# Patient Record
Sex: Female | Born: 1980 | Race: White | Hispanic: No | Marital: Single | Smoking: Current every day smoker
Health system: Southern US, Community
[De-identification: ages and names within clinical notes are randomized; demographics above are authoritative.]

## PROBLEM LIST (undated history)

## (undated) DIAGNOSIS — F4310 Post-traumatic stress disorder, unspecified: Secondary | ICD-10-CM

## (undated) DIAGNOSIS — K219 Gastro-esophageal reflux disease without esophagitis: Secondary | ICD-10-CM

## (undated) DIAGNOSIS — I509 Heart failure, unspecified: Secondary | ICD-10-CM

## (undated) DIAGNOSIS — G8929 Other chronic pain: Secondary | ICD-10-CM

## (undated) DIAGNOSIS — F32A Depression, unspecified: Secondary | ICD-10-CM

## (undated) DIAGNOSIS — F410 Panic disorder [episodic paroxysmal anxiety] without agoraphobia: Secondary | ICD-10-CM

## (undated) DIAGNOSIS — M549 Dorsalgia, unspecified: Secondary | ICD-10-CM

## (undated) DIAGNOSIS — M797 Fibromyalgia: Secondary | ICD-10-CM

## (undated) DIAGNOSIS — F419 Anxiety disorder, unspecified: Secondary | ICD-10-CM

## (undated) DIAGNOSIS — F4010 Social phobia, unspecified: Secondary | ICD-10-CM

## (undated) DIAGNOSIS — J45909 Unspecified asthma, uncomplicated: Secondary | ICD-10-CM

## (undated) DIAGNOSIS — F329 Major depressive disorder, single episode, unspecified: Secondary | ICD-10-CM

## (undated) DIAGNOSIS — F319 Bipolar disorder, unspecified: Secondary | ICD-10-CM

## (undated) HISTORY — PX: CHOLECYSTECTOMY: SHX55

---

## 2006-10-15 ENCOUNTER — Ambulatory Visit (HOSPITAL_COMMUNITY): Admission: RE | Admit: 2006-10-15 | Discharge: 2006-10-15 | Payer: Self-pay | Admitting: Family Medicine

## 2007-09-10 ENCOUNTER — Ambulatory Visit (HOSPITAL_COMMUNITY): Admission: RE | Admit: 2007-09-10 | Discharge: 2007-09-10 | Payer: Self-pay | Admitting: Neurology

## 2008-11-20 ENCOUNTER — Emergency Department (HOSPITAL_COMMUNITY): Admission: EM | Admit: 2008-11-20 | Discharge: 2008-11-21 | Payer: Self-pay | Admitting: Emergency Medicine

## 2008-11-20 ENCOUNTER — Encounter (INDEPENDENT_AMBULATORY_CARE_PROVIDER_SITE_OTHER): Payer: Self-pay | Admitting: *Deleted

## 2008-11-20 LAB — CONVERTED CEMR LAB
BUN: 9 mg/dL
CO2: 28 meq/L
Chloride: 109 meq/L
Creatinine, Ser: 0.72 mg/dL
Glomerular Filtration Rate, Af Am: >60 mL/min/{1.73_m2}
Glucose, Bld: 78 mg/dL
Platelets: 285 10*3/uL
Potassium: 2.9 meq/L

## 2008-11-21 ENCOUNTER — Inpatient Hospital Stay (HOSPITAL_COMMUNITY): Admission: EM | Admit: 2008-11-21 | Discharge: 2008-11-25 | Payer: Self-pay | Admitting: Psychiatry

## 2008-11-21 ENCOUNTER — Ambulatory Visit: Payer: Self-pay | Admitting: Psychiatry

## 2008-11-22 ENCOUNTER — Encounter (INDEPENDENT_AMBULATORY_CARE_PROVIDER_SITE_OTHER): Payer: Self-pay | Admitting: *Deleted

## 2008-11-22 ENCOUNTER — Emergency Department (HOSPITAL_COMMUNITY): Admission: EM | Admit: 2008-11-22 | Discharge: 2008-11-22 | Payer: Self-pay | Admitting: Emergency Medicine

## 2008-11-22 LAB — CONVERTED CEMR LAB
ALT: 14 units/L
Albumin: 4.2 g/dL
CO2: 22 meq/L
Calcium: 9.3 mg/dL
Chloride: 110 meq/L
Glomerular Filtration Rate, Af Am: >60 mL/min/{1.73_m2}
Glucose, Bld: 98 mg/dL
MCV: 83.9 fL
Protein, ur: NEGATIVE mg/dL
Sodium: 140 meq/L
Specific Gravity, Urine: 1.007
Total Protein: 7.1 g/dL
Urine Glucose: NEGATIVE mg/dL
WBC: 7.5 10*3/uL

## 2008-12-11 DIAGNOSIS — K219 Gastro-esophageal reflux disease without esophagitis: Secondary | ICD-10-CM

## 2008-12-11 DIAGNOSIS — M129 Arthropathy, unspecified: Secondary | ICD-10-CM | POA: Insufficient documentation

## 2008-12-11 DIAGNOSIS — J45909 Unspecified asthma, uncomplicated: Secondary | ICD-10-CM | POA: Insufficient documentation

## 2008-12-11 DIAGNOSIS — IMO0001 Reserved for inherently not codable concepts without codable children: Secondary | ICD-10-CM

## 2008-12-16 ENCOUNTER — Encounter (INDEPENDENT_AMBULATORY_CARE_PROVIDER_SITE_OTHER): Payer: Self-pay | Admitting: *Deleted

## 2010-02-28 ENCOUNTER — Encounter: Payer: Self-pay | Admitting: Family Medicine

## 2010-05-12 LAB — COMPREHENSIVE METABOLIC PANEL
ALT: 14 U/L (ref 0–35)
AST: 15 U/L (ref 0–37)
Albumin: 4.2 g/dL (ref 3.5–5.2)
Alkaline Phosphatase: 53 U/L (ref 39–117)
Alkaline Phosphatase: 57 U/L (ref 39–117)
BUN: 8 mg/dL (ref 6–23)
Calcium: 9.1 mg/dL (ref 8.4–10.5)
Calcium: 9.3 mg/dL (ref 8.4–10.5)
Chloride: 104 mEq/L (ref 96–112)
Chloride: 110 mEq/L (ref 96–112)
Creatinine, Ser: 0.6 mg/dL (ref 0.4–1.2)
Creatinine, Ser: 0.62 mg/dL (ref 0.4–1.2)
GFR calc Af Amer: >60 mL/min (ref >60)
GFR calc Af Amer: >60 mL/min (ref >60)
Glucose, Bld: 98 mg/dL (ref 70–99)
Potassium: 3.8 mEq/L (ref 3.5–5.1)
Total Bilirubin: 0.4 mg/dL (ref 0.3–1.2)
Total Protein: 7.1 g/dL (ref 6.0–8.3)

## 2010-05-12 LAB — DIFFERENTIAL
Basophils Absolute: 0 10*3/uL (ref 0.0–0.1)
Basophils Relative: 0 % (ref 0–1)
Eosinophils Absolute: 0.1 10*3/uL (ref 0.0–0.7)
Eosinophils Relative: 0 % (ref 0–5)
Lymphocytes Relative: 36 % (ref 12–46)
Monocytes Absolute: 0.4 10*3/uL (ref 0.1–1.0)
Monocytes Relative: 5 % (ref 3–12)
Neutro Abs: 5.3 10*3/uL (ref 1.7–7.7)
Neutro Abs: 5.4 10*3/uL (ref 1.7–7.7)

## 2010-05-12 LAB — LIPASE, BLOOD: Lipase: 31 U/L (ref 11–59)

## 2010-05-12 LAB — URINALYSIS, ROUTINE W REFLEX MICROSCOPIC
Glucose, UA: NEGATIVE mg/dL
Nitrite: NEGATIVE
Protein, ur: NEGATIVE mg/dL
Specific Gravity, Urine: 1.007 (ref 1.005–1.030)
Urobilinogen, UA: 0.2 mg/dL (ref 0.0–1.0)
pH: 5.5 (ref 5.0–8.0)

## 2010-05-12 LAB — RAPID URINE DRUG SCREEN, HOSP PERFORMED
Amphetamines: NOT DETECTED
Barbiturates: NOT DETECTED
Benzodiazepines: POSITIVE — AB
Opiates: POSITIVE — AB

## 2010-05-12 LAB — BASIC METABOLIC PANEL
CO2: 28 mEq/L (ref 19–32)
Creatinine, Ser: 0.72 mg/dL (ref 0.4–1.2)
GFR calc non Af Amer: >60 mL/min (ref >60)
Sodium: 141 mEq/L (ref 135–145)

## 2010-05-12 LAB — PREGNANCY, URINE: Preg Test, Ur: NEGATIVE

## 2010-05-12 LAB — URINE MICROSCOPIC-ADD ON

## 2010-05-12 LAB — CBC
Hemoglobin: 12 g/dL (ref 12.0–15.0)
MCV: 83.9 fL (ref 78.0–100.0)
Platelets: 272 10*3/uL (ref 150–400)
RBC: 4.27 MIL/uL (ref 3.87–5.11)

## 2012-05-26 ENCOUNTER — Emergency Department (HOSPITAL_COMMUNITY): Payer: Self-pay

## 2012-05-26 ENCOUNTER — Encounter (HOSPITAL_COMMUNITY): Payer: Self-pay | Admitting: Emergency Medicine

## 2012-05-26 ENCOUNTER — Emergency Department (HOSPITAL_COMMUNITY)
Admission: EM | Admit: 2012-05-26 | Discharge: 2012-05-26 | Disposition: A | Discharge status: Home / Self Care | Payer: Self-pay | Attending: Emergency Medicine | Admitting: Emergency Medicine

## 2012-05-26 DIAGNOSIS — R319 Hematuria, unspecified: Secondary | ICD-10-CM | POA: Insufficient documentation

## 2012-05-26 DIAGNOSIS — Z8709 Personal history of other diseases of the respiratory system: Secondary | ICD-10-CM | POA: Insufficient documentation

## 2012-05-26 DIAGNOSIS — R1031 Right lower quadrant pain: Secondary | ICD-10-CM | POA: Insufficient documentation

## 2012-05-26 DIAGNOSIS — Z7982 Long term (current) use of aspirin: Secondary | ICD-10-CM | POA: Insufficient documentation

## 2012-05-26 DIAGNOSIS — F411 Generalized anxiety disorder: Secondary | ICD-10-CM | POA: Insufficient documentation

## 2012-05-26 DIAGNOSIS — Z3202 Encounter for pregnancy test, result negative: Secondary | ICD-10-CM | POA: Insufficient documentation

## 2012-05-26 DIAGNOSIS — R109 Unspecified abdominal pain: Secondary | ICD-10-CM

## 2012-05-26 DIAGNOSIS — F4310 Post-traumatic stress disorder, unspecified: Secondary | ICD-10-CM | POA: Insufficient documentation

## 2012-05-26 DIAGNOSIS — F17200 Nicotine dependence, unspecified, uncomplicated: Secondary | ICD-10-CM | POA: Insufficient documentation

## 2012-05-26 DIAGNOSIS — IMO0002 Reserved for concepts with insufficient information to code with codable children: Secondary | ICD-10-CM | POA: Insufficient documentation

## 2012-05-26 DIAGNOSIS — Z79899 Other long term (current) drug therapy: Secondary | ICD-10-CM | POA: Insufficient documentation

## 2012-05-26 DIAGNOSIS — F319 Bipolar disorder, unspecified: Secondary | ICD-10-CM | POA: Insufficient documentation

## 2012-05-26 HISTORY — DX: Depression, unspecified: F32.A

## 2012-05-26 HISTORY — DX: Anxiety disorder, unspecified: F41.9

## 2012-05-26 HISTORY — DX: Major depressive disorder, single episode, unspecified: F32.9

## 2012-05-26 HISTORY — DX: Post-traumatic stress disorder, unspecified: F43.10

## 2012-05-26 HISTORY — DX: Bipolar disorder, unspecified: F31.9

## 2012-05-26 HISTORY — DX: Unspecified asthma, uncomplicated: J45.909

## 2012-05-26 LAB — COMPREHENSIVE METABOLIC PANEL WITH GFR
ALT: 11 U/L (ref 0–35)
AST: 14 U/L (ref 0–37)
Albumin: 4.1 g/dL (ref 3.5–5.2)
Alkaline Phosphatase: 65 U/L (ref 39–117)
BUN: 12 mg/dL (ref 6–23)
CO2: 27 meq/L (ref 19–32)
Calcium: 9.4 mg/dL (ref 8.4–10.5)
Chloride: 103 meq/L (ref 96–112)
Creatinine, Ser: 0.71 mg/dL (ref 0.50–1.10)
GFR calc Af Amer: >90 mL/min
GFR calc non Af Amer: >90 mL/min
Glucose, Bld: 76 mg/dL (ref 70–99)
Potassium: 3.1 meq/L — ABNORMAL LOW (ref 3.5–5.1)
Sodium: 142 meq/L (ref 135–145)
Total Bilirubin: 0.1 mg/dL — ABNORMAL LOW (ref 0.3–1.2)
Total Protein: 7.1 g/dL (ref 6.0–8.3)

## 2012-05-26 LAB — URINALYSIS, ROUTINE W REFLEX MICROSCOPIC
Glucose, UA: NEGATIVE mg/dL
Leukocytes, UA: NEGATIVE
Nitrite: NEGATIVE
Specific Gravity, Urine: 1.025 (ref 1.005–1.030)

## 2012-05-26 LAB — CBC WITH DIFFERENTIAL/PLATELET
Basophils Absolute: 0.1 10*3/uL (ref 0.0–0.1)
Basophils Relative: 1 % (ref 0–1)
Eosinophils Absolute: 0.2 10*3/uL (ref 0.0–0.7)
Eosinophils Relative: 2 % (ref 0–5)
HCT: 40.2 % (ref 36.0–46.0)
Hemoglobin: 13.6 g/dL (ref 12.0–15.0)
Lymphocytes Relative: 43 % (ref 12–46)
Lymphs Abs: 3.5 10*3/uL (ref 0.7–4.0)
MCH: 28.5 pg (ref 26.0–34.0)
MCHC: 33.8 g/dL (ref 30.0–36.0)
MCV: 84.1 fL (ref 78.0–100.0)
Monocytes Absolute: 0.5 10*3/uL (ref 0.1–1.0)
Monocytes Relative: 6 % (ref 3–12)
Neutro Abs: 3.9 10*3/uL (ref 1.7–7.7)
Neutrophils Relative %: 48 % (ref 43–77)
Platelets: 270 10*3/uL (ref 150–400)
RBC: 4.78 MIL/uL (ref 3.87–5.11)
RDW: 15.4 % (ref 11.5–15.5)
WBC: 8.1 10*3/uL (ref 4.0–10.5)

## 2012-05-26 LAB — URINE MICROSCOPIC-ADD ON

## 2012-05-26 LAB — POCT PREGNANCY, URINE: Preg Test, Ur: NEGATIVE

## 2012-05-26 MED ORDER — CIPROFLOXACIN HCL 500 MG PO TABS: 500.0000 mg | ORAL_TABLET | Freq: Two times a day (BID) | ORAL | Status: DC

## 2012-05-26 MED ORDER — RANITIDINE HCL 150 MG PO CAPS: 150.0000 mg | ORAL_CAPSULE | Freq: Two times a day (BID) | ORAL | Status: DC

## 2012-05-26 MED ORDER — HYDROCODONE-ACETAMINOPHEN 5-325 MG PO TABS: 1.0000 | ORAL_TABLET | Freq: Four times a day (QID) | ORAL | Status: DC | PRN

## 2012-05-26 MED ORDER — HYDROMORPHONE HCL PF 1 MG/ML IJ SOLN
1.0000 mg | Freq: Once | INTRAMUSCULAR | Status: AC
  Administered 2012-05-26: 1 mg via INTRAVENOUS
  Filled 2012-05-26: qty 1

## 2012-05-26 MED ORDER — PANTOPRAZOLE SODIUM 40 MG PO TBEC
40.0000 mg | DELAYED_RELEASE_TABLET | Freq: Once | ORAL | Status: AC
  Administered 2012-05-26: 40 mg via ORAL
  Filled 2012-05-26: qty 1

## 2012-05-26 MED ORDER — CIPROFLOXACIN HCL 250 MG PO TABS
500.0000 mg | ORAL_TABLET | Freq: Once | ORAL | Status: AC
  Administered 2012-05-26: 500 mg via ORAL
  Filled 2012-05-26: qty 2

## 2012-05-26 MED ORDER — ONDANSETRON HCL 4 MG/2ML IJ SOLN
4.0000 mg | Freq: Once | INTRAMUSCULAR | Status: AC
  Administered 2012-05-26: 4 mg via INTRAVENOUS
  Filled 2012-05-26: qty 2

## 2012-05-26 MED ORDER — OXYCODONE-ACETAMINOPHEN 5-325 MG PO TABS: 1.0000 | ORAL_TABLET | Freq: Four times a day (QID) | ORAL | Status: DC | PRN
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
## 2012-05-26 NOTE — ED Notes (Signed)
Pt gone to xray

## 2012-05-27 MED FILL — Oxycodone w/ Acetaminophen Tab 5-325 MG: ORAL | Qty: 6 | Status: AC

## 2012-05-29 ENCOUNTER — Encounter (HOSPITAL_COMMUNITY): Payer: Self-pay

## 2012-05-29 DIAGNOSIS — E876 Hypokalemia: Secondary | ICD-10-CM | POA: Insufficient documentation

## 2012-05-29 DIAGNOSIS — M549 Dorsalgia, unspecified: Secondary | ICD-10-CM | POA: Insufficient documentation

## 2012-05-29 DIAGNOSIS — Z79899 Other long term (current) drug therapy: Secondary | ICD-10-CM | POA: Insufficient documentation

## 2012-05-29 DIAGNOSIS — F17200 Nicotine dependence, unspecified, uncomplicated: Secondary | ICD-10-CM | POA: Insufficient documentation

## 2012-05-29 DIAGNOSIS — F411 Generalized anxiety disorder: Secondary | ICD-10-CM | POA: Insufficient documentation

## 2012-05-29 DIAGNOSIS — R1013 Epigastric pain: Secondary | ICD-10-CM | POA: Insufficient documentation

## 2012-05-29 DIAGNOSIS — F319 Bipolar disorder, unspecified: Secondary | ICD-10-CM | POA: Insufficient documentation

## 2012-05-29 DIAGNOSIS — Z8659 Personal history of other mental and behavioral disorders: Secondary | ICD-10-CM | POA: Insufficient documentation

## 2012-05-29 DIAGNOSIS — Z7982 Long term (current) use of aspirin: Secondary | ICD-10-CM | POA: Insufficient documentation

## 2012-05-29 DIAGNOSIS — J45909 Unspecified asthma, uncomplicated: Secondary | ICD-10-CM | POA: Insufficient documentation

## 2012-05-29 NOTE — ED Notes (Signed)
Back pain, epigastric pain and burning, hurting in both my kidneys. They said it was a bleeding ulcer. I have been taking meds for bladder infection and ulcer, i havent been able to eat in days per pt.

## 2012-05-30 ENCOUNTER — Emergency Department (HOSPITAL_COMMUNITY)
Admission: EM | Admit: 2012-05-30 | Discharge: 2012-05-30 | Disposition: A | Discharge status: Home / Self Care | Payer: Self-pay | Attending: Emergency Medicine | Admitting: Emergency Medicine

## 2012-05-30 DIAGNOSIS — E876 Hypokalemia: Secondary | ICD-10-CM

## 2012-05-30 DIAGNOSIS — R109 Unspecified abdominal pain: Secondary | ICD-10-CM

## 2012-05-30 DIAGNOSIS — M549 Dorsalgia, unspecified: Secondary | ICD-10-CM

## 2012-05-30 LAB — BASIC METABOLIC PANEL
CO2: 25 mEq/L (ref 19–32)
Calcium: 9.3 mg/dL (ref 8.4–10.5)
Chloride: 104 mEq/L (ref 96–112)
Creatinine, Ser: 0.64 mg/dL (ref 0.50–1.10)
GFR calc Af Amer: >90 mL/min (ref >90)
Sodium: 140 mEq/L (ref 135–145)

## 2012-05-30 LAB — CBC WITH DIFFERENTIAL/PLATELET
Basophils Absolute: 0 10*3/uL (ref 0.0–0.1)
Basophils Relative: 0 % (ref 0–1)
Lymphocytes Relative: 44 % (ref 12–46)
MCHC: 33.6 g/dL (ref 30.0–36.0)
Neutro Abs: 3.6 10*3/uL (ref 1.7–7.7)
Platelets: 286 10*3/uL (ref 150–400)
RDW: 15.5 % (ref 11.5–15.5)
WBC: 7.5 10*3/uL (ref 4.0–10.5)

## 2012-05-30 LAB — URINE MICROSCOPIC-ADD ON

## 2012-05-30 LAB — URINALYSIS, ROUTINE W REFLEX MICROSCOPIC
Glucose, UA: NEGATIVE mg/dL
Ketones, ur: NEGATIVE mg/dL
Leukocytes, UA: NEGATIVE
Nitrite: NEGATIVE
pH: 7 (ref 5.0–8.0)

## 2012-05-30 MED ORDER — PANTOPRAZOLE SODIUM 40 MG IV SOLR
40.0000 mg | Freq: Once | INTRAVENOUS | Status: AC
Start: 2012-05-30 — End: 2012-05-30
  Administered 2012-05-30: 40 mg via INTRAVENOUS
  Filled 2012-05-30: qty 40

## 2012-05-30 MED ORDER — MORPHINE SULFATE 4 MG/ML IJ SOLN
2.0000 mg | Freq: Once | INTRAMUSCULAR | Status: AC
  Administered 2012-05-30: 2 mg via INTRAVENOUS
  Filled 2012-05-30: qty 1

## 2012-05-30 MED ORDER — SODIUM CHLORIDE 0.9 % IV BOLUS (SEPSIS)
1000.0000 mL | Freq: Once | INTRAVENOUS | Status: AC
  Administered 2012-05-30: 1000 mL via INTRAVENOUS

## 2012-05-30 MED ORDER — ONDANSETRON HCL 4 MG/2ML IJ SOLN
4.0000 mg | Freq: Once | INTRAMUSCULAR | Status: AC
  Administered 2012-05-30: 4 mg via INTRAVENOUS
  Filled 2012-05-30: qty 2

## 2012-05-30 MED ORDER — SODIUM CHLORIDE 0.9 % IV SOLN
Freq: Once | INTRAVENOUS | Status: AC
  Administered 2012-05-30: 03:00:00 via INTRAVENOUS

## 2012-05-30 MED ORDER — POTASSIUM CHLORIDE CRYS ER 20 MEQ PO TBCR
60.0000 meq | EXTENDED_RELEASE_TABLET | Freq: Once | ORAL | Status: AC
  Administered 2012-05-30: 60 meq via ORAL
  Filled 2012-05-30: qty 3

## 2012-05-30 NOTE — ED Provider Notes (Signed)
History     CSN: [id number]  Arrival date & time 05/29/12  2112   First MD Initiated Contact with Patient 05/30/12 0100      Chief Complaint  Patient presents with  . Abdominal Pain  . Back Pain    (Consider location/radiation/quality/duration/timing/severity/associated sxs/prior treatment) HPI Samantha Richardson is a 32 y.o. female who presents to the Emergency Department complaining of  Continued pain to her right side and back since her visit here on 05/26/12 when she was placed on antibiotics, pain medicine and antacid. She states she was told she had a bleeding ulcer and she now has epigastric pain as well. She has been unable to eat or drink since being here on 05/26/12. Denies fever, chills, chest pain, shortness of breath. Past Medical History  Diagnosis Date  . Asthma   . PTSD (post-traumatic stress disorder)   . Anxiety   . Depressed   . Bipolar 1 disorder     Past Surgical History  Procedure Laterality Date  . Cholecystectomy    . Cesarean section      x2    No family history on file.  History  Substance Use Topics  . Smoking status: Current Every Day Smoker -- 0.50 packs/day  . Smokeless tobacco: Not on file  . Alcohol Use: No    OB History   Grav Para Term Preterm Abortions TAB SAB Ect Mult Living                  Review of Systems  Constitutional: Negative for fever.       10 Systems reviewed and are negative for acute change except as noted in the HPI.  HENT: Negative for congestion.   Eyes: Negative for discharge and redness.  Respiratory: Negative for cough and shortness of breath.   Cardiovascular: Negative for chest pain.  Gastrointestinal: Positive for nausea, vomiting and abdominal pain.  Musculoskeletal: Positive for back pain.  Skin: Negative for rash.  Neurological: Negative for syncope, numbness and headaches.  Psychiatric/Behavioral:       No behavior change.    Allergies  Review of patient's allergies indicates no known  allergies.  Home Medications   Current Outpatient Rx  Name  Route  Sig  Dispense  Refill  . aspirin EC 81 MG tablet   Oral   Take 81 mg by mouth once.         . ciprofloxacin (CIPRO) 500 MG tablet   Oral   Take 1 tablet (500 mg total) by mouth 2 (two) times daily.   14 tablet   0   . clonazePAM (KLONOPIN) 1 MG tablet   Oral   Take 0.5-1 mg by mouth 3 (three) times daily as needed for anxiety.          Marland Kitchen HYDROcodone-acetaminophen (NORCO/VICODIN) 5-325 MG per tablet   Oral   Take 1 tablet by mouth every 6 (six) hours as needed for pain.   20 tablet   0   . ibuprofen (ADVIL,MOTRIN) 200 MG tablet   Oral   Take 800 mg by mouth daily as needed for pain.         Marland Kitchen oxyCODONE-acetaminophen (PERCOCET) 5-325 MG per tablet   Oral   Take 1 tablet by mouth every 6 (six) hours as needed for pain.   6 tablet   0   . ranitidine (ZANTAC) 150 MG capsule   Oral   Take 1 capsule (150 mg total) by mouth 2 (two) times daily.  60 capsule   0     BP 132/103  Pulse 89  Temp(Src) 98.4 F (36.9 C) (Oral)  Resp 20  Ht 5\' 5"  (1.651 m)  Wt 173 lb 4.8 oz (78.608 kg)  BMI 28.84 kg/m2  SpO2 95%  LMP 05/04/2012  Physical Exam  Nursing note and vitals reviewed. Constitutional: She appears well-developed and well-nourished.  Awake, alert, nontoxic appearance.  HENT:  Head: Normocephalic and atraumatic.  Right Ear: External ear normal.  Left Ear: External ear normal.  Eyes: EOM are normal. Pupils are equal, round, and reactive to light.  Neck: Neck supple.  Cardiovascular: Normal rate and intact distal pulses.   Pulmonary/Chest: Effort normal and breath sounds normal. She exhibits no tenderness.  Abdominal: Soft. Bowel sounds are normal. There is no tenderness. There is no rebound.  Genitourinary:  No cva tenderness  Musculoskeletal: She exhibits no tenderness.  Baseline ROM, no obvious new focal weakness.  Neurological:  Mental status and motor strength appears baseline for  patient and situation.  Skin: No rash noted.  Psychiatric: She has a normal mood and affect.    ED Course  Procedures (including critical care time) Results for orders placed during the hospital encounter of 05/30/12  CBC WITH DIFFERENTIAL      Result Value Range   WBC 7.5  4.0 - 10.5 K/uL   RBC 4.82  3.87 - 5.11 MIL/uL   Hemoglobin 13.6  12.0 - 15.0 g/dL   HCT 52.8  41.3 - 24.4 %   MCV 84.0  78.0 - 100.0 fL   MCH 28.2  26.0 - 34.0 pg   MCHC 33.6  30.0 - 36.0 g/dL   RDW 01.0  27.2 - 53.6 %   Platelets 286  150 - 400 K/uL   Neutrophils Relative 48  43 - 77 %   Neutro Abs 3.6  1.7 - 7.7 K/uL   Lymphocytes Relative 44  12 - 46 %   Lymphs Abs 3.3  0.7 - 4.0 K/uL   Monocytes Relative 6  3 - 12 %   Monocytes Absolute 0.4  0.1 - 1.0 K/uL   Eosinophils Relative 2  0 - 5 %   Eosinophils Absolute 0.2  0.0 - 0.7 K/uL   Basophils Relative 0  0 - 1 %   Basophils Absolute 0.0  0.0 - 0.1 K/uL  BASIC METABOLIC PANEL      Result Value Range   Sodium 140  135 - 145 mEq/L   Potassium 3.1 (*) 3.5 - 5.1 mEq/L   Chloride 104  96 - 112 mEq/L   CO2 25  19 - 32 mEq/L   Glucose, Bld 99  70 - 99 mg/dL   BUN 11  6 - 23 mg/dL   Creatinine, Ser 6.44  0.50 - 1.10 mg/dL   Calcium 9.3  8.4 - 03.4 mg/dL   GFR calc non Af Amer >90  >90 mL/min   GFR calc Af Amer >90  >90 mL/min  URINALYSIS, ROUTINE W REFLEX MICROSCOPIC      Result Value Range   Color, Urine RED (*) YELLOW   APPearance HAZY (*) CLEAR   Specific Gravity, Urine <1.005 (*) 1.005 - 1.030   pH 7.0  5.0 - 8.0   Glucose, UA NEGATIVE  NEGATIVE mg/dL   Hgb urine dipstick LARGE (*) NEGATIVE   Bilirubin Urine NEGATIVE  NEGATIVE   Ketones, ur NEGATIVE  NEGATIVE mg/dL   Protein, ur TRACE (*) NEGATIVE mg/dL   Urobilinogen, UA 0.2  0.0 - 1.0 mg/dL   Nitrite NEGATIVE  NEGATIVE   Leukocytes, UA NEGATIVE  NEGATIVE  URINE MICROSCOPIC-ADD ON      Result Value Range   Squamous Epithelial / LPF RARE  RARE   WBC, UA 0-2  <3 WBC/hpf   RBC / HPF TOO  NUMEROUS TO COUNT  <3 RBC/hpf   Bacteria, UA FEW (*) RARE    Medications  0.9 %  sodium chloride infusion ( Intravenous New Bag/Given 05/30/12 0313)  sodium chloride 0.9 % bolus 1,000 mL (1,000 mLs Intravenous New Bag/Given 05/30/12 0305)  ondansetron (ZOFRAN) injection 4 mg (4 mg Intravenous Given 05/30/12 0305)  pantoprazole (PROTONIX) injection 40 mg (40 mg Intravenous Given 05/30/12 0310)  morphine 4 MG/ML injection 2 mg (2 mg Intravenous Given 05/30/12 0306)  potassium chloride SA (K-DUR,KLOR-CON) CR tablet 60 mEq (60 mEq Oral Given 05/30/12 0406)   2115 Reviewed CT results from 05/26/12 with patient. There is no mention of a bleeding ulcer. Made a copy of the CT for the patient.  MDM  Patient with c/o abdominal pain and back pain. She is on cipro and zantac. She was given IVF, narcotics and zofran with relief. Reviewed results with patient. Her potassium is low and she was given potassium while here. Pt stable in ED with no significant deterioration in condition.The patient appears reasonably screened and/or stabilized for discharge and I doubt any other medical condition or other [facility] requiring further screening, evaluation, or treatment in the ED at this time prior to discharge.  MDM Reviewed: nursing note and vitals Interpretation: labs         Nicoletta Dress. Colon Branch, MD 05/30/12 [phone]

## 2012-06-06 LAB — URINE CULTURE
Colony Count: NO GROWTH
Culture: NO GROWTH

## 2012-06-10 ENCOUNTER — Emergency Department (HOSPITAL_COMMUNITY)
Admission: EM | Admit: 2012-06-10 | Discharge: 2012-06-10 | Disposition: A | Discharge status: Home / Self Care | Payer: Self-pay | Attending: Emergency Medicine | Admitting: Emergency Medicine

## 2012-06-10 ENCOUNTER — Encounter (HOSPITAL_COMMUNITY): Payer: Self-pay | Admitting: Emergency Medicine

## 2012-06-10 DIAGNOSIS — J45909 Unspecified asthma, uncomplicated: Secondary | ICD-10-CM | POA: Insufficient documentation

## 2012-06-10 DIAGNOSIS — Z8659 Personal history of other mental and behavioral disorders: Secondary | ICD-10-CM | POA: Insufficient documentation

## 2012-06-10 DIAGNOSIS — K047 Periapical abscess without sinus: Secondary | ICD-10-CM | POA: Insufficient documentation

## 2012-06-10 DIAGNOSIS — F411 Generalized anxiety disorder: Secondary | ICD-10-CM | POA: Insufficient documentation

## 2012-06-10 DIAGNOSIS — F3289 Other specified depressive episodes: Secondary | ICD-10-CM | POA: Insufficient documentation

## 2012-06-10 DIAGNOSIS — Z79899 Other long term (current) drug therapy: Secondary | ICD-10-CM | POA: Insufficient documentation

## 2012-06-10 DIAGNOSIS — F17200 Nicotine dependence, unspecified, uncomplicated: Secondary | ICD-10-CM | POA: Insufficient documentation

## 2012-06-10 DIAGNOSIS — F329 Major depressive disorder, single episode, unspecified: Secondary | ICD-10-CM | POA: Insufficient documentation

## 2012-06-10 DIAGNOSIS — F319 Bipolar disorder, unspecified: Secondary | ICD-10-CM | POA: Insufficient documentation

## 2012-06-10 MED ORDER — TRAMADOL HCL 50 MG PO TABS
50.0000 mg | ORAL_TABLET | Freq: Once | ORAL | Status: AC
  Administered 2012-06-10: 50 mg via ORAL
  Filled 2012-06-10: qty 1

## 2012-06-10 MED ORDER — AMOXICILLIN 500 MG PO CAPS: 500.0000 mg | ORAL_CAPSULE | Freq: Three times a day (TID) | ORAL | Status: DC

## 2012-06-10 MED ORDER — AMOXICILLIN 250 MG PO CAPS
500.0000 mg | ORAL_CAPSULE | Freq: Once | ORAL | Status: AC
  Administered 2012-06-10: 500 mg via ORAL
  Filled 2012-06-10: qty 2

## 2012-06-10 MED ORDER — TRAMADOL HCL 50 MG PO TABS: 50.0000 mg | ORAL_TABLET | Freq: Four times a day (QID) | ORAL | Status: DC | PRN

## 2012-06-10 MED ORDER — HYDROCODONE-ACETAMINOPHEN 5-325 MG PO TABS: 1.0000 | ORAL_TABLET | Freq: Once | ORAL | Status: DC

## 2012-06-10 NOTE — ED Provider Notes (Signed)
Medical screening examination/treatment/procedure(s) were performed by non-physician practitioner and as supervising physician I was immediately available for consultation/collaboration.  Dione Booze, MD 06/10/12 [phone]

## 2012-06-10 NOTE — ED Notes (Signed)
Pt c/o dental pain x 3 days to upper teeth. No swelling noted. Taking ibuprofen, last dose at 2am. Nad.

## 2012-06-10 NOTE — ED Provider Notes (Signed)
History     CSN: [id number]  Arrival date & time 06/10/12  0726   First MD Initiated Contact with Patient 06/10/12 [phone]      Chief Complaint  Patient presents with  . Dental Pain    (Consider location/radiation/quality/duration/timing/severity/associated sxs/prior treatment) HPI Comments: Samantha Richardson is a 32 y.o. Female presenting with a 3 day history of dental pain and gingival swelling.   The patient has a history of injury to the tooth involved which has recently started to cause pain.  There has been no fevers,  Chills, nausea or vomiting, also no complaint of difficulty swallowing,  Although chewing makes pain worse.  The patient has tried no medications prior to arrival.  She has had decreased PO intake since her pain started.     The history is provided by the patient.    Past Medical History  Diagnosis Date  . Asthma   . PTSD (post-traumatic stress disorder)   . Anxiety   . Depressed   . Bipolar 1 disorder     Past Surgical History  Procedure Laterality Date  . Cholecystectomy    . Cesarean section      x2    History reviewed. No pertinent family history.  History  Substance Use Topics  . Smoking status: Current Every Day Smoker -- 0.50 packs/day  . Smokeless tobacco: Not on file  . Alcohol Use: No    OB History   Grav Para Term Preterm Abortions TAB SAB Ect Mult Living                  Review of Systems  Constitutional: Negative for fever.  HENT: Positive for dental problem. Negative for sore throat, facial swelling, neck pain and neck stiffness.   Respiratory: Negative for shortness of breath.     Allergies  Review of patient's allergies indicates no known allergies.  Home Medications   Current Outpatient Rx  Name  Route  Sig  Dispense  Refill  . albuterol (PROVENTIL HFA;VENTOLIN HFA) 108 (90 BASE) MCG/ACT inhaler   Inhalation   Inhale 2 puffs into the lungs every 6 (six) hours as needed for wheezing.         . clonazePAM  (KLONOPIN) 1 MG tablet   Oral   Take 0.5-1 mg by mouth 3 (three) times daily as needed for anxiety.          Marland Kitchen ibuprofen (ADVIL,MOTRIN) 200 MG tablet   Oral   Take 800 mg by mouth daily as needed for pain.         Marland Kitchen amoxicillin (AMOXIL) 500 MG capsule   Oral   Take 1 capsule (500 mg total) by mouth 3 (three) times daily.   30 capsule   0   . traMADol (ULTRAM) 50 MG tablet   Oral   Take 1 tablet (50 mg total) by mouth every 6 (six) hours as needed for pain.   15 tablet   0     BP 128/93  Pulse 111  Temp(Src) 97.8 F (36.6 C) (Oral)  Resp 20  SpO2 100%  LMP 05/27/2012  Physical Exam  Constitutional: She is oriented to person, place, and time. She appears well-developed and well-nourished. No distress.  HENT:  Head: Normocephalic and atraumatic.  Right Ear: Tympanic membrane and external ear normal.  Left Ear: Tympanic membrane and external ear normal.  Mouth/Throat: Oropharynx is clear and moist and mucous membranes are normal. No oral lesions. Dental abscesses present.    Generalized poor  dentition with multiple cavities.  #9 and #10 are both significantly decayed nearly to the gingival line.  There is surrounding gingival edema and tenderness, there is no fluctuance and no drainage.  Eyes: Conjunctivae are normal.  Neck: Normal range of motion. Neck supple.  Cardiovascular: Normal rate and normal heart sounds.   Pulmonary/Chest: Effort normal.  Abdominal: She exhibits no distension.  Musculoskeletal: Normal range of motion.  Lymphadenopathy:    She has no cervical adenopathy.  Neurological: She is alert and oriented to person, place, and time.  Skin: Skin is warm and dry. No erythema.  Psychiatric: She has a normal mood and affect.    ED Course  Procedures (including critical care time)  Labs Reviewed - No data to display No results found.   1. Dental abscess       MDM  Dental abscess with moderate generalized dental decay.  Patient was prescribed  amoxicillin, prescribed tramadol for pain relief.  She was given dental referrals including the flyer for the free dental clinic at the end of this month.        Burgess Amor, PA-C 06/10/12 [phone]

## 2012-06-17 ENCOUNTER — Encounter (HOSPITAL_COMMUNITY): Payer: Self-pay | Admitting: *Deleted

## 2012-06-17 ENCOUNTER — Emergency Department (HOSPITAL_COMMUNITY)
Admission: EM | Admit: 2012-06-17 | Discharge: 2012-06-18 | Disposition: A | Discharge status: Home / Self Care | Payer: Self-pay | Attending: Emergency Medicine | Admitting: Emergency Medicine

## 2012-06-17 DIAGNOSIS — Z79899 Other long term (current) drug therapy: Secondary | ICD-10-CM | POA: Insufficient documentation

## 2012-06-17 DIAGNOSIS — J45909 Unspecified asthma, uncomplicated: Secondary | ICD-10-CM | POA: Insufficient documentation

## 2012-06-17 DIAGNOSIS — F1120 Opioid dependence, uncomplicated: Secondary | ICD-10-CM | POA: Insufficient documentation

## 2012-06-17 DIAGNOSIS — F17200 Nicotine dependence, unspecified, uncomplicated: Secondary | ICD-10-CM | POA: Insufficient documentation

## 2012-06-17 DIAGNOSIS — F411 Generalized anxiety disorder: Secondary | ICD-10-CM | POA: Insufficient documentation

## 2012-06-17 DIAGNOSIS — F319 Bipolar disorder, unspecified: Secondary | ICD-10-CM | POA: Insufficient documentation

## 2012-06-17 DIAGNOSIS — F4310 Post-traumatic stress disorder, unspecified: Secondary | ICD-10-CM | POA: Insufficient documentation

## 2012-06-17 MED ORDER — HYDROXYZINE HCL 25 MG PO TABS: 25.0000 mg | ORAL_TABLET | Freq: Four times a day (QID) | ORAL | Status: DC | PRN

## 2012-06-17 MED ORDER — ALUM & MAG HYDROXIDE-SIMETH 200-200-20 MG/5ML PO SUSP: 30.0000 mL | ORAL | Status: DC | PRN

## 2012-06-17 MED ORDER — DICYCLOMINE HCL 10 MG PO CAPS: 20.0000 mg | ORAL_CAPSULE | Freq: Four times a day (QID) | ORAL | Status: DC | PRN

## 2012-06-17 MED ORDER — ACETAMINOPHEN 325 MG PO TABS: 650.0000 mg | ORAL_TABLET | ORAL | Status: DC | PRN

## 2012-06-17 MED ORDER — METHOCARBAMOL 500 MG PO TABS: 500.0000 mg | ORAL_TABLET | Freq: Three times a day (TID) | ORAL | Status: DC | PRN

## 2012-06-17 MED ORDER — LOPERAMIDE HCL 2 MG PO CAPS: 2.0000 mg | ORAL_CAPSULE | ORAL | Status: DC | PRN

## 2012-06-17 MED ORDER — NICOTINE 21 MG/24HR TD PT24: 21.0000 mg | MEDICATED_PATCH | Freq: Every day | TRANSDERMAL | Status: DC

## 2012-06-17 MED ORDER — NAPROXEN 250 MG PO TABS: 500.0000 mg | ORAL_TABLET | Freq: Two times a day (BID) | ORAL | Status: DC | PRN

## 2012-06-17 MED ORDER — ONDANSETRON 4 MG PO TBDP: 4.0000 mg | ORAL_TABLET | Freq: Four times a day (QID) | ORAL | Status: DC | PRN

## 2012-06-17 NOTE — ED Provider Notes (Signed)
History    This chart was scribed for Sunnie Nielsen, MD by Leone Payor, ED Scribe. This patient was seen in room APA15/APA15 and the patient's care was started 10:26 PM.   CSN: [id number]  Arrival date & time 06/17/12  2047   None     Chief Complaint  Patient presents with  . Addiction Problem    The history is provided by the patient. No language interpreter was used.    HPI Comments: Samantha Richardson is a 32 y.o. 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 Pt states she was admitted in2010 for similar problem. Last pill usage was today. She takes hydrocodone, percocet, phenol patches. States she has shoot up morphine last summer. She has some depression. She denies SI. She takes klonopin regularly. Pt has h/o bipolar 1 disorder, depression, PTSD, anxiety.      Past Medical History  Diagnosis Date  . Asthma   . PTSD (post-traumatic stress disorder)   . Anxiety   . Depressed   . Bipolar 1 disorder     Past Surgical History  Procedure Laterality Date  . Cholecystectomy    . Cesarean section      x2    No family history on file.  History  Substance Use Topics  . Smoking status: Current Every Day Smoker -- 0.50 packs/day  . Smokeless tobacco: Not on file  . Alcohol Use: No    OB History   Grav Para Term Preterm Abortions TAB SAB Ect Mult Living                  Review of Systems  All other systems reviewed and are negative.    Allergies  Review of patient's allergies indicates no known allergies.  Home Medications   Current Outpatient Rx  Name  Route  Sig  Dispense  Refill  . amoxicillin (AMOXIL) 500 MG capsule   Oral   Take 1 capsule (500 mg total) by mouth 3 (three) times daily.   30 capsule   0   . clonazePAM (KLONOPIN) 1 MG tablet   Oral   Take 0.5-1 mg by mouth 3 (three) times daily as needed for anxiety.          Marland Kitchen ibuprofen (ADVIL,MOTRIN) 200 MG tablet   Oral   Take 800 mg  by mouth daily as needed for pain.         . traMADol (ULTRAM) 50 MG tablet   Oral   Take 1 tablet (50 mg total) by mouth every 6 (six) hours as needed for pain.   15 tablet   0   . albuterol (PROVENTIL HFA;VENTOLIN HFA) 108 (90 BASE) MCG/ACT inhaler   Inhalation   Inhale 2 puffs into the lungs every 6 (six) hours as needed for wheezing.           BP 113/76  Pulse 107  Temp(Src) 98.9 F (37.2 C) (Oral)  Resp 20  Ht 5\' 5"  (1.651 m)  Wt 178 lb 1.6 oz (80.786 kg)  BMI 29.64 kg/m2  SpO2 100%  LMP 05/27/2012  Physical Exam  Nursing note and vitals reviewed. Constitutional: She is oriented to person, place, and time. She appears well-developed and well-nourished. No distress.  HENT:  Head: Normocephalic and atraumatic.  Eyes: EOM are normal.  Neck: Neck supple. No tracheal deviation present.  Cardiovascular: Normal rate and regular rhythm.   Pulmonary/Chest: Effort normal and breath sounds normal. No respiratory distress.  Abdominal: Soft. Bowel sounds are normal. She exhibits no distension. There is no tenderness.  Musculoskeletal: Normal range of motion.  Neurological: She is alert and oriented to person, place, and time. No cranial nerve deficit.  Skin: Skin is warm and dry.  Psychiatric: She has a normal mood and affect. Her behavior is normal.    ED Course  Procedures (including critical care time)  DIAGNOSTIC STUDIES: Oxygen Saturation is 100% on room air, normal by my interpretation.    COORDINATION OF CARE: 11:25 PM Discussed treatment plan with pt at bedside and pt agreed to plan.   Results for orders placed during the hospital encounter of 06/17/12  CBC      Result Value Range   WBC 9.8  4.0 - 10.5 K/uL   RBC 4.59  3.87 - 5.11 MIL/uL   Hemoglobin 13.0  12.0 - 15.0 g/dL   HCT 45.4  09.8 - 11.9 %   MCV 85.0  78.0 - 100.0 fL   MCH 28.3  26.0 - 34.0 pg   MCHC 33.3  30.0 - 36.0 g/dL   RDW 14.7 (*) 82.9 - 56.2 %   Platelets 251  150 - 400 K/uL   URINALYSIS, ROUTINE W REFLEX MICROSCOPIC      Result Value Range   Color, Urine YELLOW  YELLOW   APPearance CLEAR  CLEAR   Specific Gravity, Urine 1.025  1.005 - 1.030   pH 6.0  5.0 - 8.0   Glucose, UA NEGATIVE  NEGATIVE mg/dL   Hgb urine dipstick NEGATIVE  NEGATIVE   Bilirubin Urine NEGATIVE  NEGATIVE   Ketones, ur NEGATIVE  NEGATIVE mg/dL   Protein, ur NEGATIVE  NEGATIVE mg/dL   Urobilinogen, UA 0.2  0.0 - 1.0 mg/dL   Nitrite NEGATIVE  NEGATIVE   Leukocytes, UA NEGATIVE  NEGATIVE     12:22 AM d/w ACT team, no opiate detox inpatient available, recs [facility] outpatient. Referrals provided. No SI, psychosis or indication for admit at this time   MDM  Opiate addiction requesting detox, is detox and is wishing to have inpatient detox - no resources available for same with ACT consult.  Outpatient referral and Rx provided. Stable for discharge at this time.       I personally performed the services described in this documentation, which was scribed in my presence. The recorded information has been reviewed and is accurate.    Sunnie Nielsen, MD 06/18/12 [facility] Manners

## 2012-06-17 NOTE — ED Notes (Signed)
Patient had visitor give her money while visiting. Patient placed one $5 bill in book bag. Explained to patient that she was not allowed to keep belongings on her. Patients bag placed back in locker

## 2012-06-17 NOTE — ED Notes (Signed)
2 back packs dropped off by visitor. Bags labeled & placed in locked storage.

## 2012-06-17 NOTE — ED Notes (Signed)
States she has been using opiates since 2006

## 2012-06-18 ENCOUNTER — Inpatient Hospital Stay (HOSPITAL_COMMUNITY)
Admission: EM | Admit: 2012-06-18 | Discharge: 2012-06-24 | DRG: 897 | Disposition: A | Discharge status: Home / Self Care | Payer: PRIVATE HEALTH INSURANCE | Source: Intra-hospital | Attending: Psychiatry | Admitting: Psychiatry

## 2012-06-18 ENCOUNTER — Emergency Department (HOSPITAL_COMMUNITY)
Admission: EM | Admit: 2012-06-18 | Discharge: 2012-06-18 | Disposition: A | Discharge status: Other Acute Inpatient Hospital | Payer: Self-pay | Attending: Emergency Medicine | Admitting: Emergency Medicine

## 2012-06-18 ENCOUNTER — Encounter (HOSPITAL_COMMUNITY): Payer: Self-pay | Admitting: Emergency Medicine

## 2012-06-18 DIAGNOSIS — J45909 Unspecified asthma, uncomplicated: Secondary | ICD-10-CM | POA: Diagnosis present

## 2012-06-18 DIAGNOSIS — F3289 Other specified depressive episodes: Secondary | ICD-10-CM | POA: Diagnosis present

## 2012-06-18 DIAGNOSIS — F319 Bipolar disorder, unspecified: Secondary | ICD-10-CM | POA: Insufficient documentation

## 2012-06-18 DIAGNOSIS — S61509A Unspecified open wound of unspecified wrist, initial encounter: Secondary | ICD-10-CM | POA: Insufficient documentation

## 2012-06-18 DIAGNOSIS — M549 Dorsalgia, unspecified: Secondary | ICD-10-CM | POA: Insufficient documentation

## 2012-06-18 DIAGNOSIS — Z8659 Personal history of other mental and behavioral disorders: Secondary | ICD-10-CM | POA: Insufficient documentation

## 2012-06-18 DIAGNOSIS — T398X2A Poisoning by other nonopioid analgesics and antipyretics, not elsewhere classified, intentional self-harm, initial encounter: Secondary | ICD-10-CM | POA: Insufficient documentation

## 2012-06-18 DIAGNOSIS — Z79899 Other long term (current) drug therapy: Secondary | ICD-10-CM | POA: Insufficient documentation

## 2012-06-18 DIAGNOSIS — G8929 Other chronic pain: Secondary | ICD-10-CM | POA: Diagnosis present

## 2012-06-18 DIAGNOSIS — T400X1A Poisoning by opium, accidental (unintentional), initial encounter: Secondary | ICD-10-CM | POA: Insufficient documentation

## 2012-06-18 DIAGNOSIS — T394X2A Poisoning by antirheumatics, not elsewhere classified, intentional self-harm, initial encounter: Secondary | ICD-10-CM | POA: Insufficient documentation

## 2012-06-18 DIAGNOSIS — F411 Generalized anxiety disorder: Secondary | ICD-10-CM | POA: Insufficient documentation

## 2012-06-18 DIAGNOSIS — IMO0001 Reserved for inherently not codable concepts without codable children: Secondary | ICD-10-CM | POA: Insufficient documentation

## 2012-06-18 DIAGNOSIS — F1120 Opioid dependence, uncomplicated: Principal | ICD-10-CM | POA: Diagnosis present

## 2012-06-18 DIAGNOSIS — X789XXA Intentional self-harm by unspecified sharp object, initial encounter: Secondary | ICD-10-CM | POA: Insufficient documentation

## 2012-06-18 DIAGNOSIS — F17200 Nicotine dependence, unspecified, uncomplicated: Secondary | ICD-10-CM | POA: Insufficient documentation

## 2012-06-18 DIAGNOSIS — F329 Major depressive disorder, single episode, unspecified: Secondary | ICD-10-CM

## 2012-06-18 HISTORY — DX: Fibromyalgia: M79.7

## 2012-06-18 HISTORY — DX: Other chronic pain: G89.29

## 2012-06-18 HISTORY — DX: Dorsalgia, unspecified: M54.9

## 2012-06-18 HISTORY — DX: Gastro-esophageal reflux disease without esophagitis: K21.9

## 2012-06-18 LAB — CBC
Hemoglobin: 13 g/dL (ref 12.0–15.0)
MCH: 28.3 pg (ref 26.0–34.0)
MCHC: 33.3 g/dL (ref 30.0–36.0)
MCV: 85 fL (ref 78.0–100.0)
Platelets: 251 10*3/uL (ref 150–400)

## 2012-06-18 LAB — ETHANOL: Alcohol, Ethyl (B): <11 mg/dL (ref 0–11)

## 2012-06-18 LAB — URINALYSIS, ROUTINE W REFLEX MICROSCOPIC
Bilirubin Urine: NEGATIVE
Glucose, UA: NEGATIVE mg/dL
Ketones, ur: NEGATIVE mg/dL
Leukocytes, UA: NEGATIVE
Protein, ur: NEGATIVE mg/dL

## 2012-06-18 LAB — COMPREHENSIVE METABOLIC PANEL
Albumin: 3.8 g/dL (ref 3.5–5.2)
Alkaline Phosphatase: 59 U/L (ref 39–117)
BUN: 11 mg/dL (ref 6–23)
Creatinine, Ser: 0.7 mg/dL (ref 0.50–1.10)
GFR calc Af Amer: >90 mL/min (ref >90)
Glucose, Bld: 80 mg/dL (ref 70–99)
Potassium: 3.2 mEq/L — ABNORMAL LOW (ref 3.5–5.1)
Total Bilirubin: 0.1 mg/dL — ABNORMAL LOW (ref 0.3–1.2)
Total Protein: 6.6 g/dL (ref 6.0–8.3)

## 2012-06-18 LAB — RAPID URINE DRUG SCREEN, HOSP PERFORMED
Amphetamines: NOT DETECTED
Barbiturates: NOT DETECTED
Benzodiazepines: POSITIVE — AB
Cocaine: NOT DETECTED
Opiates: POSITIVE — AB
Tetrahydrocannabinol: POSITIVE — AB
Tetrahydrocannabinol: POSITIVE — AB

## 2012-06-18 LAB — BASIC METABOLIC PANEL
Calcium: 8.8 mg/dL (ref 8.4–10.5)
Creatinine, Ser: 0.75 mg/dL (ref 0.50–1.10)
GFR calc non Af Amer: >90 mL/min (ref >90)
Glucose, Bld: 81 mg/dL (ref 70–99)
Sodium: 142 mEq/L (ref 135–145)

## 2012-06-18 LAB — CBC WITH DIFFERENTIAL/PLATELET
Basophils Relative: 0 % (ref 0–1)
Eosinophils Absolute: 0.3 10*3/uL (ref 0.0–0.7)
HCT: 38 % (ref 36.0–46.0)
Hemoglobin: 12.6 g/dL (ref 12.0–15.0)
Lymphs Abs: 4.4 10*3/uL — ABNORMAL HIGH (ref 0.7–4.0)
MCH: 28.1 pg (ref 26.0–34.0)
MCHC: 33.2 g/dL (ref 30.0–36.0)
MCV: 84.8 fL (ref 78.0–100.0)
Monocytes Absolute: 0.6 10*3/uL (ref 0.1–1.0)
Monocytes Relative: 7 % (ref 3–12)
RBC: 4.48 MIL/uL (ref 3.87–5.11)

## 2012-06-18 LAB — ACETAMINOPHEN LEVEL: Acetaminophen (Tylenol), Serum: <15 ug/mL (ref 10–30)

## 2012-06-18 MED ORDER — NICOTINE 14 MG/24HR TD PT24
14.0000 mg | MEDICATED_PATCH | Freq: Once | TRANSDERMAL | Status: DC
  Administered 2012-06-18: 14 mg via TRANSDERMAL
  Filled 2012-06-18: qty 1

## 2012-06-18 MED ORDER — LORAZEPAM 1 MG PO TABS
1.0000 mg | ORAL_TABLET | Freq: Once | ORAL | Status: AC
  Administered 2012-06-18: 1 mg via ORAL
  Filled 2012-06-18: qty 1

## 2012-06-18 MED ORDER — BACITRACIN ZINC 500 UNIT/GM EX OINT
TOPICAL_OINTMENT | CUTANEOUS | Status: AC
  Filled 2012-06-18: qty 2.7

## 2012-06-18 MED ORDER — CLONIDINE HCL 0.2 MG PO TABS: 0.1000 mg | ORAL_TABLET | Freq: Two times a day (BID) | ORAL | Status: DC

## 2012-06-18 NOTE — ED Notes (Signed)
Pt woke again & advised she has been discharged. Pt wanted to be sent away to be discharged, EDP advised & states he has already spoke to [place].

## 2012-06-18 NOTE — ED Notes (Addendum)
Patient reports suicidal ideations. Patient seen here last night and discharged. Per boyfriend patient has tried suicide before and is starting to cut her arms. Patient reports addiction to opioids in which she wants to help with. Per patient after being discharged from here last night without any help she went and "got high."  Patient reports taking 10 hydrocodone 7.5mg  and/or 10mg  around 09:30 or 10:00.

## 2012-06-18 NOTE — [facility] Counselor (Signed)
Samantha Richardson, ACT counselor at [facility], submitted Pt for admission to [facility]. Samantha Sievert, PA reviewed clinical information and accepted Pt to the service of Dr. Henrietta Richardson, room [phone]. Notified Samantha Richardson and Samantha Night, RN of acceptance.  Samantha Richardson, LPC, [facility] Assessment Counselor
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
## 2012-06-18 NOTE — ED Provider Notes (Signed)
History    This chart was scribed for Samantha Lennert, MD by Gerlean Ren, ED Scribe. This patient was seen in room APA15/APA15 and the patient's care was started at 6:13 PM    CSN: [id number]  Arrival date & time 06/18/12  1728   First MD Initiated Contact with Patient 06/18/12 1757      Chief Complaint  Patient presents with  . V70.1  . Drug Overdose     The history is provided by the patient. No language interpreter was used.  Samantha Richardson is a 32 y.o. female with h/o depression and bipolar disorder who presents to the Emergency Department complaining of suicidal ideations with an attempt by cutting her wrists today.  Pt reports she was seen here yesterday seeking help detoxing from a long-standing opiate addiction but states she was not treated properly and became suicidal due to frustration not finding any help.  Pt denies any prior suicide attempts.  Pt expresses that she does not want to die but that her current life appears hopeless and she is concerned she will never find help for her addiction.     Past Medical History  Diagnosis Date  . Asthma   . PTSD (post-traumatic stress disorder)   . Anxiety   . Depressed   . Bipolar 1 disorder   . Fibromyalgia   . Chronic back pain     Past Surgical History  Procedure Laterality Date  . Cholecystectomy    . Cesarean section      x2    History reviewed. No pertinent family history.  History  Substance Use Topics  . Smoking status: Current Every Day Smoker -- 0.50 packs/day for 25 years    Types: Cigarettes  . Smokeless tobacco: Never Used  . Alcohol Use: No    OB History   Grav Para Term Preterm Abortions TAB SAB Ect Mult Living   3 2 1 1 1  1   2       Review of Systems  Constitutional: Negative for appetite change and fatigue.  HENT: Negative for congestion, sinus pressure and ear discharge.   Eyes: Negative for discharge.  Respiratory: Negative for cough.   Cardiovascular: Negative for chest pain.   Gastrointestinal: Negative for abdominal pain and diarrhea.  Genitourinary: Negative for frequency and hematuria.  Musculoskeletal: Negative for back pain.  Skin: Negative for rash.  Neurological: Negative for seizures and headaches.  Psychiatric/Behavioral: Positive for suicidal ideas and self-injury. Negative for hallucinations.    Allergies  Review of patient's allergies indicates no known allergies.  Home Medications   Current Outpatient Rx  Name  Route  Sig  Dispense  Refill  . clonazePAM (KLONOPIN) 1 MG tablet   Oral   Take 0.5-1 mg by mouth 3 (three) times daily as needed for anxiety.          Marland Kitchen ibuprofen (ADVIL,MOTRIN) 200 MG tablet   Oral   Take 800 mg by mouth daily as needed for pain.         . traMADol (ULTRAM) 50 MG tablet   Oral   Take 1 tablet (50 mg total) by mouth every 6 (six) hours as needed for pain.   15 tablet   0   . albuterol (PROVENTIL HFA;VENTOLIN HFA) 108 (90 BASE) MCG/ACT inhaler   Inhalation   Inhale 2 puffs into the lungs every 6 (six) hours as needed for wheezing.         . cloNIDine (CATAPRES) 0.2 MG tablet  Oral   Take 0.5 tablets (0.1 mg total) by mouth 2 (two) times daily.   10 tablet   0     BP 135/82  Pulse 110  Temp(Src) 98 F (36.7 C) (Oral)  Resp 18  Ht 5\' 5"  (1.651 m)  Wt 160 lb (72.576 kg)  BMI 26.63 kg/m2  SpO2 100%  LMP 05/27/2012  Physical Exam  Nursing note and vitals reviewed. Constitutional: She is oriented to person, place, and time. She appears well-developed.  HENT:  Head: Normocephalic.  Eyes: Conjunctivae and EOM are normal. No scleral icterus.  Neck: Neck supple. No thyromegaly present.  Cardiovascular: Normal rate and regular rhythm.  Exam reveals no gallop and no friction rub.   No murmur heard. Pulmonary/Chest: No stridor. She has no wheezes. She has no rales. She exhibits no tenderness.  Abdominal: She exhibits no distension. There is no tenderness. There is no rebound.  Musculoskeletal:  Normal range of motion. She exhibits no edema.  Lymphadenopathy:    She has no cervical adenopathy.  Neurological: She is oriented to person, place, and time. Coordination normal.  Skin: No rash noted. No erythema.  Psychiatric:  Pt is tearful    ED Course  Procedures (including critical care time) DIAGNOSTIC STUDIES: Oxygen Saturation is 100% on room air, normal by my interpretation.    COORDINATION OF CARE: 6:18 PM- Informed pt she will receive telepsych evaluation to determine further treatment plan.  She verbalizes understanding.   Labs Reviewed  URINE RAPID DRUG SCREEN (HOSP PERFORMED)  CBC WITH DIFFERENTIAL  COMPREHENSIVE METABOLIC PANEL  ACETAMINOPHEN LEVEL   No results found.   No diagnosis found.    MDM        The chart was scribed for me under my direct supervision.  I personally performed the history, physical, and medical decision making and all procedures in the evaluation of this patient.Samantha Lennert, MD 06/18/12 2229

## 2012-06-18 NOTE — ED Notes (Signed)
Pt sleeping when entered the room. Pt was woke up in order to give discharge instructions. Pt did not say anything after getting paperwork

## 2012-06-19 ENCOUNTER — Encounter (HOSPITAL_COMMUNITY): Payer: Self-pay | Admitting: *Deleted

## 2012-06-19 DIAGNOSIS — F1120 Opioid dependence, uncomplicated: Secondary | ICD-10-CM | POA: Diagnosis present

## 2012-06-19 DIAGNOSIS — F411 Generalized anxiety disorder: Secondary | ICD-10-CM | POA: Diagnosis present

## 2012-06-19 DIAGNOSIS — F329 Major depressive disorder, single episode, unspecified: Secondary | ICD-10-CM

## 2012-06-19 MED ORDER — ONDANSETRON 4 MG PO TBDP: 4.0000 mg | ORAL_TABLET | Freq: Four times a day (QID) | ORAL | Status: AC | PRN

## 2012-06-19 MED ORDER — LOPERAMIDE HCL 2 MG PO CAPS: 2.0000 mg | ORAL_CAPSULE | ORAL | Status: AC | PRN

## 2012-06-19 MED ORDER — MAGNESIUM HYDROXIDE 400 MG/5ML PO SUSP: 30.0000 mL | Freq: Every day | ORAL | Status: DC | PRN

## 2012-06-19 MED ORDER — IBUPROFEN 600 MG PO TABS
600.0000 mg | ORAL_TABLET | Freq: Four times a day (QID) | ORAL | Status: DC | PRN
  Administered 2012-06-23 – 2012-06-24 (×2): 600 mg via ORAL
  Filled 2012-06-19 (×2): qty 1

## 2012-06-19 MED ORDER — POTASSIUM CHLORIDE CRYS ER 20 MEQ PO TBCR
20.0000 meq | EXTENDED_RELEASE_TABLET | Freq: Two times a day (BID) | ORAL | Status: AC
  Administered 2012-06-19 – 2012-06-20 (×4): 20 meq via ORAL
  Filled 2012-06-19 (×5): qty 1

## 2012-06-19 MED ORDER — CLONIDINE HCL 0.1 MG PO TABS
0.1000 mg | ORAL_TABLET | Freq: Four times a day (QID) | ORAL | Status: AC
  Administered 2012-06-19 – 2012-06-21 (×9): 0.1 mg via ORAL
  Filled 2012-06-19 (×11): qty 1

## 2012-06-19 MED ORDER — CHLORDIAZEPOXIDE HCL 25 MG PO CAPS
25.0000 mg | ORAL_CAPSULE | Freq: Four times a day (QID) | ORAL | Status: DC | PRN
  Administered 2012-06-20 – 2012-06-24 (×13): 25 mg via ORAL
  Filled 2012-06-19 (×13): qty 1

## 2012-06-19 MED ORDER — NAPROXEN 500 MG PO TABS
500.0000 mg | ORAL_TABLET | Freq: Two times a day (BID) | ORAL | Status: AC | PRN
  Administered 2012-06-20 – 2012-06-22 (×2): 500 mg via ORAL
  Filled 2012-06-19 (×2): qty 1

## 2012-06-19 MED ORDER — DULOXETINE HCL 60 MG PO CPEP
120.0000 mg | ORAL_CAPSULE | Freq: Every day | ORAL | Status: DC
  Administered 2012-06-19 – 2012-06-23 (×5): 120 mg via ORAL
  Filled 2012-06-19 (×4): qty 2
  Filled 2012-06-19: qty 28
  Filled 2012-06-19 (×3): qty 2

## 2012-06-19 MED ORDER — TRAZODONE HCL 50 MG PO TABS
50.0000 mg | ORAL_TABLET | Freq: Every evening | ORAL | Status: DC | PRN
  Administered 2012-06-19 – 2012-06-23 (×7): 50 mg via ORAL
  Filled 2012-06-19 (×6): qty 1
  Filled 2012-06-19: qty 28
  Filled 2012-06-19 (×6): qty 1
  Filled 2012-06-19: qty 28

## 2012-06-19 MED ORDER — NICOTINE 21 MG/24HR TD PT24
21.0000 mg | MEDICATED_PATCH | Freq: Every day | TRANSDERMAL | Status: DC
  Administered 2012-06-19 – 2012-06-24 (×6): 21 mg via TRANSDERMAL
  Filled 2012-06-19 (×9): qty 1

## 2012-06-19 MED ORDER — NICOTINE 14 MG/24HR TD PT24
14.0000 mg | MEDICATED_PATCH | Freq: Every day | TRANSDERMAL | Status: DC
  Administered 2012-06-19: 14 mg via TRANSDERMAL
  Filled 2012-06-19 (×4): qty 1

## 2012-06-19 MED ORDER — GABAPENTIN 100 MG PO CAPS
200.0000 mg | ORAL_CAPSULE | Freq: Three times a day (TID) | ORAL | Status: DC
  Administered 2012-06-19 – 2012-06-21 (×6): 200 mg via ORAL
  Filled 2012-06-19 (×10): qty 2

## 2012-06-19 MED ORDER — ACETAMINOPHEN 325 MG PO TABS: 650.0000 mg | ORAL_TABLET | Freq: Four times a day (QID) | ORAL | Status: DC | PRN

## 2012-06-19 MED ORDER — CLONIDINE HCL 0.1 MG PO TABS
0.1000 mg | ORAL_TABLET | ORAL | Status: AC
  Administered 2012-06-21 – 2012-06-23 (×4): 0.1 mg via ORAL
  Filled 2012-06-19 (×4): qty 1

## 2012-06-19 MED ORDER — HYDROXYZINE HCL 25 MG PO TABS
25.0000 mg | ORAL_TABLET | Freq: Four times a day (QID) | ORAL | Status: AC | PRN
  Administered 2012-06-19 – 2012-06-21 (×3): 25 mg via ORAL
  Filled 2012-06-19: qty 1

## 2012-06-19 MED ORDER — DICYCLOMINE HCL 20 MG PO TABS: 20.0000 mg | ORAL_TABLET | Freq: Four times a day (QID) | ORAL | Status: AC | PRN

## 2012-06-19 MED ORDER — ALUM & MAG HYDROXIDE-SIMETH 200-200-20 MG/5ML PO SUSP: 30.0000 mL | ORAL | Status: DC | PRN

## 2012-06-19 MED ORDER — CLONIDINE HCL 0.1 MG PO TABS
0.1000 mg | ORAL_TABLET | Freq: Every day | ORAL | Status: DC
  Administered 2012-06-24: 0.1 mg via ORAL
  Filled 2012-06-19 (×2): qty 1

## 2012-06-19 MED ORDER — ALBUTEROL SULFATE HFA 108 (90 BASE) MCG/ACT IN AERS: 2.0000 | INHALATION_SPRAY | Freq: Four times a day (QID) | RESPIRATORY_TRACT | Status: DC | PRN

## 2012-06-19 MED ORDER — NICOTINE 21 MG/24HR TD PT24
MEDICATED_PATCH | TRANSDERMAL | Status: AC
  Filled 2012-06-19: qty 1

## 2012-06-19 MED ORDER — METHOCARBAMOL 500 MG PO TABS
500.0000 mg | ORAL_TABLET | Freq: Three times a day (TID) | ORAL | Status: AC | PRN
  Administered 2012-06-23: 500 mg via ORAL
  Filled 2012-06-19: qty 1

## 2012-06-19 NOTE — [facility] Group Notes (Signed)
[facility] LCSW Group Therapy  06/19/2012  1:15 PM   Type of Therapy:  Group Therapy  Participation Level:  Active  Participation Quality:  Drowsy and Inattentive  Affect:  Depressed, Flat and Lethargic  Cognitive:  Lacking  Insight:  Limited  Engagement in Therapy:  Limited  Modes of Intervention:  Clarification, Confrontation, Discussion, Education, Exploration, Limit-setting, Orientation, Problem-solving, Rapport Building, Dance movement psychotherapist, Socialization and Support  Summary of Progress/Problems: The topic for group today was emotional regulation.  Pt participated in the discussion regarding what emotional regulation is and how it affects their life, positive and negative.  Pt discussed coping skills and ways they can regulate their emotions in a positive manner.   Pt did not have much to share, stating that she was observing the group process since this was her first full day.  Pt than slept for the remainder of group.  When woken up, pt states that she hasn't slept in days due to staying between various places.    Samantha Richardson, [place] 06/19/2012 2:46 PM

## 2012-06-19 NOTE — Progress Notes (Signed)
D-Patient is out in milieu interacting with peers and attending groups. Rates depression at 6.  Denies SI. Reports low energy and poor appetite.Anxiety r/t withdrawal and prn medication requested and received. A- Support,encouragement and diagnosis education done.  POC continued and evaluation of treatment goals. Cont 15' checks for safety. R- Remains safe.
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
## 2012-06-19 NOTE — Progress Notes (Signed)
Adult Psychoeducational Group Note  Date:  06/19/2012 Time:  6:22 PM  Group Topic/Focus:  Crisis Planning:   The purpose of this group is to help patients create a crisis plan for use upon discharge or in the future, as needed.  Participation Level:  None  Participation Quality:  Drowsy and Inattentive  Affect:  Lethargic and pt was sleeping throughout group.  Cognitive:  pt slept throughout group  Insight: None, pt did not participate in group discussion.  Engagement in Group:  None  Modes of Intervention:  Discussion and Education  Additional Comments:  Pt attended group but did not participate as she slept throughout group.   Dalia Heading 06/19/2012, 6:22 PM
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
## 2012-06-19 NOTE — Tx Team (Signed)
Interdisciplinary Treatment Plan Update (Adult)  Date: 06/19/2012  Time Reviewed:  9:45 AM  Progress in Treatment: Attending groups: Yes Participating in groups:  Yes Taking medication as prescribed:  Yes Tolerating medication:  Yes Family/Significant othe contact made: CSW assessing Patient understands diagnosis:  Yes Discussing patient identified problems/goals with staff:  Yes Medical problems stabilized or resolved:  Yes Denies suicidal/homicidal ideation: Yes Issues/concerns per patient self-inventory:  Yes Other:  New problem(s) identified: N/A  Discharge Plan or Barriers: CSW assessing for appropriate referrals.    Reason for Continuation of Hospitalization: Anxiety Depression Medication Stabilization  Comments: N/A  Estimated length of stay: Fri or Monday d/c  For review of initial/current patient goals, please see plan of care.  Attendees: Patient:     Family:     Physician:  Dr. Dub Mikes 06/19/2012 8:38 AM   Nursing:   Roswell Miners, RN 06/19/2012 8:38 AM   Clinical Social Worker:  Reyes Ivan, LCSWA 06/19/2012 8:38 AM   Other: Robbie Louis, RN 06/19/2012 8:38 AM   Other:  Serena Colonel, NP 06/19/2012 9:51 AM   Other:  Liliane Bade, BSW (Transitional Care Manager) 06/19/2012 9:52 AM   Other:     Other:    Other:    Other:    Other:    Other:    Other:     Scribe for Treatment Team:   Carmina Miller, 06/19/2012 , 8:38 AM

## 2012-06-19 NOTE — [facility] Suicide Risk Assessment (Signed)
Suicide Risk Assessment  Admission Assessment     Nursing information obtained from:  Patient Demographic factors:  Divorced or widowed;Caucasian;Low socioeconomic status;Unemployed;NA (homeless) Current Mental Status:  Self-harm thoughts Loss Factors:  Decrease in vocational status;Loss of significant relationship;Financial problems / change in socioeconomic status Historical Factors:  Prior suicide attempts;Family history of mental illness or substance abuse Risk Reduction Factors:  Responsible for children under 75 years of age;Positive social support  CLINICAL FACTORS:   Severe Anxiety and/or Agitation Depression:   Comorbid alcohol abuse/dependence Alcohol/Substance Abuse/Dependencies  COGNITIVE FEATURES THAT CONTRIBUTE TO RISK: None identified   SUICIDE RISK:   Moderate:  Frequent suicidal ideation with limited intensity, and duration, some specificity in terms of plans, no associated intent, good self-control, limited dysphoria/symptomatology, some risk factors present, and identifiable protective factors, including available and accessible social support.  PLAN OF CARE: Supportive approach/coping skills/relapse prevention                               Reassess and address the co morbidities  I certify that inpatient services furnished can reasonably be expected to improve the patient's condition.  Davin Muramoto A 06/19/2012, 12:29 PM
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
## 2012-06-19 NOTE — [facility] Suicide Risk Assessment (Signed)
[facility] INPATIENT:  Family/Significant Other Suicide Prevention Education  Suicide Prevention Education:  Education Completed; Samantha Richardson - mother [phone]),  (name of family member/significant other) has been identified by the patient as the family member/significant other with whom the patient will be residing, and identified as the person(s) who will aid the patient in the event of a mental health crisis (suicidal ideations/suicide attempt).  With written consent from the patient, the family member/significant other has been provided the following suicide prevention education, prior to the and/or following the discharge of the patient.  The suicide prevention education provided includes the following:  Suicide risk factors  Suicide prevention and interventions  National Suicide Hotline telephone number  [facility] assessment telephone number  [facility] Emergency Assistance 911  [facility] and/or Residential Mobile Crisis Unit telephone number  Request made of family/significant other to:  Remove weapons (e.g., guns, rifles, knives), all items previously/currently identified as safety concern.    Remove drugs/medications (over-the-counter, prescriptions, illicit drugs), all items previously/currently identified as a safety concern.  The family member/significant other verbalizes understanding of the suicide prevention education information provided.  The family member/significant other agrees to remove the items of safety concern listed above. Mother states that she has been trying to get pt in this hospital for years to get help with substance use.  Pt states that the drugs have taken pt all the way down to homeless.    Samantha Richardson 06/19/2012, 3:25 PM
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
## 2012-06-19 NOTE — B[facility] Group Notes (Signed)
[facility] LCSW Aftercare Discharge Planning Group Note   06/19/2012 8:45 AM  Participation Quality:  Alert and Appropriate   Mood/Affect:  Appropriate, Flat and Depressed  Depression Rating:  4  Anxiety Rating:  9  Thoughts of Suicide:  Pt denies SI/HI  Will you contract for safety?   Yes  Current AVH:  No  Plan for Discharge/Comments:  Pt attended discharge planning group and actively participated in group.  CSW provided pt with today's workbook.  Pt shared what brought her to the hospital, stating "her life is in the toilet".  Pt states that she is addicted to opiates.  Pt wants to go to [facility] upon d/c for treatment and [facility] for recovery living upon d/c from [facility].  CSW will refer pt once stable to d/c.  No further needs voiced by pt at this time.    Transportation Means: Yes  Supports: None mentioned today  Reyes Ivan, LCSWA 06/19/2012 9:54 AM

## 2012-06-20 NOTE — Progress Notes (Signed)
Patient did attend the evening karaoke group.  

## 2012-06-20 NOTE — Progress Notes (Signed)
Recreation Therapy Notes  Date: 05.15.2014 Time: 3:00pm Location: [address] Dayroom      Group Topic/Focus: Leisure Education  Participation Level: Active  Participation Quality: Appropriate and Attentive  Affect: Flat  Cognitive: Appropriate   Additional Comments: Activity: Leisure Alphabet; Explanation: Patients as a group created a list of recreation/leisure activities to correspond with each letter of the alphabet.   Patient attended group, but did not participate in group activity. Patient did not contribute to group list.    Jearl Klinefelter, LRT/CTRS  Jearl Klinefelter 06/20/2012 4:40 PM

## 2012-06-20 NOTE — Progress Notes (Signed)
D    Pt is pleasant on approach but does endorse anxiety and irritability due to withdrawal   She spent a good deal of time on the phone and has been tearful at times   Pt attends and participates in groups and is compliant with treatment A   Verbal support given   Medications administered and effectiveness monitored   Q 15 min checks R   Pt safe at present

## 2012-06-20 NOTE — BHH Group Notes (Addendum)
[facility] LCSW Aftercare Discharge Planning Group Note   06/20/2012 8:45 AM  Participation Quality:  Alert and Appropriate   Mood/Affect:  Appropriate, Flat and Depressed  Depression Rating:  6  Anxiety Rating:  6  Thoughts of Suicide:  Pt denies SI/HI  Will you contract for safety?   Yes  Current AVH:  No  Plan for Discharge/Comments:  Pt attended discharge planning group and actively participated in group.  CSW provided pt with today's workbook.  Pt presents with flat affect and depressed mood.  Pt still voices the same d/c plan of [facility] and [facility] for further treatment.  CSW will refer pt when stable.  No further needs voiced by pt at this time.    Transportation Means: Pt denies having transportation, would need [facility] to pick pt up upon d/c.     Supports: Mother is supportive  Samantha Richardson, Samantha Richardson 06/20/2012 9:24 AM

## 2012-06-20 NOTE — Progress Notes (Signed)
[facility] MD Progress Note  06/20/2012 1:09 PM Samantha Richardson  MRN:  [id number]  Subjective: "Samantha Richardson reports, "I'm doing okay. Just trying to deal with my opoid addiction and cope with withdrawal symptoms. I feel high anxiety, the shakes and I'm week also. I slept well last night. I have to stay quiet right now because I'm not feeling too well".  Diagnosis:   Axis I: Opioid dependence Axis II: Deferred Axis III:  Past Medical History  Diagnosis Date  . Asthma   . PTSD (post-traumatic stress disorder)   . Anxiety   . Depressed   . Bipolar 1 disorder   . Fibromyalgia   . Chronic back pain   . GERD (gastroesophageal reflux disease)    Axis IV: other psychosocial or environmental problems and Opioid dependence Axis V: 41-50 serious symptoms  ADL's:  Fairly intact.  Sleep: Good  Appetite:  Fair  Suicidal Ideation:  Plan:  Denies Intent:  Denies Means:  Denies Homicidal Ideation:  Plan:  Denies Intent:  denies Means:  denies  AEB (as evidenced by): Per patient's reports.  Psychiatric Specialty Exam: Review of Systems  Constitutional: Negative.   HENT: Negative.   Eyes: Negative.   Respiratory: Negative.   Cardiovascular: Negative.   Gastrointestinal: Negative.   Genitourinary: Negative.   Musculoskeletal: Negative.   Skin: Negative.   Neurological: Negative.   Endo/Heme/Allergies: Negative.   Psychiatric/Behavioral: Positive for substance abuse (Opioid dependence). Negative for depression, suicidal ideas, hallucinations and memory loss. The patient is nervous/anxious (Currently being stabilized with medication) and has insomnia (Currently being stabilized with medication.).     Blood pressure 108/73, pulse 105, temperature 99 F (37.2 C), temperature source Oral, resp. rate 17, height 5\' 4"  (1.626 m), weight 79.379 kg (175 lb), last menstrual period 05/27/2012.Body mass index is 30.02 kg/(m^2).  General Appearance: Fairly Groomed  Patent attorney::  Fair  Speech:   Slow  Volume:  Decreased  Mood:  Anxious  Affect:  Flat  Thought Process:  Coherent  Orientation:  Full (Time, Place, and Person)  Thought Content:  Rumination  Suicidal Thoughts:  No  Homicidal Thoughts:  No  Memory:  Immediate;   Good Recent;   Good Remote;   Fair  Judgement:  Fair  Insight:  Fair  Psychomotor Activity:  Restlessness and Anxious, tremors  Concentration:  Fair  Recall:  Fair  Akathisia:  No  Handed:  Right  AIMS (if indicated):     Assets:  Desire for Improvement  Sleep:  Number of Hours: 6.5   Current Medications: Current Facility-Administered Medications  Medication Dose Route Frequency Provider Last Rate Last Dose  . acetaminophen (TYLENOL) tablet 650 mg  650 mg Oral Q6H PRN Kerry Hough, PA-C      . albuterol (PROVENTIL HFA;VENTOLIN HFA) 108 (90 BASE) MCG/ACT inhaler 2 puff  2 puff Inhalation Q6H PRN Kerry Hough, PA-C      . alum & mag hydroxide-simeth (MAALOX/MYLANTA) 200-200-20 MG/5ML suspension 30 mL  30 mL Oral Q4H PRN Kerry Hough, PA-C      . chlordiazePOXIDE (LIBRIUM) capsule 25 mg  25 mg Oral QID PRN Kerry Hough, PA-C      . cloNIDine (CATAPRES) tablet 0.1 mg  0.1 mg Oral QID Kerry Hough, PA-C   0.1 mg at 06/20/12 1202   Followed by  . [START ON 06/21/2012] cloNIDine (CATAPRES) tablet 0.1 mg  0.1 mg Oral BH-qamhs Spencer E Simon, PA-C       Followed by  . [  START ON 06/24/2012] cloNIDine (CATAPRES) tablet 0.1 mg  0.1 mg Oral QAC breakfast Kerry Hough, PA-C      . dicyclomine (BENTYL) tablet 20 mg  20 mg Oral Q6H PRN Kerry Hough, PA-C      . DULoxetine (CYMBALTA) DR capsule 120 mg  120 mg Oral QHS Kerry Hough, PA-C   120 mg at 06/19/12 2131  . gabapentin (NEURONTIN) capsule 200 mg  200 mg Oral TID Rachael Fee, MD   200 mg at 06/20/12 1203  . hydrOXYzine (ATARAX/VISTARIL) tablet 25 mg  25 mg Oral Q6H PRN Kerry Hough, PA-C   25 mg at 06/19/12 1446  . ibuprofen (ADVIL,MOTRIN) tablet 600 mg  600 mg Oral Q6H PRN Kerry Hough, PA-C      . loperamide (IMODIUM) capsule 2-4 mg  2-4 mg Oral PRN Kerry Hough, PA-C      . magnesium hydroxide (MILK OF MAGNESIA) suspension 30 mL  30 mL Oral Daily PRN Kerry Hough, PA-C      . methocarbamol (ROBAXIN) tablet 500 mg  500 mg Oral Q8H PRN Kerry Hough, PA-C      . naproxen (NAPROSYN) tablet 500 mg  500 mg Oral BID PRN Kerry Hough, PA-C      . nicotine (NICODERM CQ - dosed in mg/24 hours) patch 21 mg  21 mg Transdermal Daily Rachael Fee, MD   21 mg at 06/20/12 1610  . ondansetron (ZOFRAN-ODT) disintegrating tablet 4 mg  4 mg Oral Q6H PRN Kerry Hough, PA-C      . potassium chloride SA (K-DUR,KLOR-CON) CR tablet 20 mEq  20 mEq Oral BID Kerry Hough, PA-C   20 mEq at 06/20/12 0806  . traZODone (DESYREL) tablet 50 mg  50 mg Oral QHS,MR X 1 Kerry Hough, PA-C   50 mg at 06/19/12 2132    Lab Results:  Results for orders placed during the hospital encounter of 06/18/12 (from the past 48 hour(s))  URINE RAPID DRUG SCREEN (HOSP PERFORMED)     Status: Abnormal   Collection Time    06/18/12  6:13 PM      Result Value Range   Opiates POSITIVE (*) NONE DETECTED   Cocaine NONE DETECTED  NONE DETECTED   Benzodiazepines POSITIVE (*) NONE DETECTED   Amphetamines NONE DETECTED  NONE DETECTED   Tetrahydrocannabinol POSITIVE (*) NONE DETECTED   Barbiturates NONE DETECTED  NONE DETECTED   Comment:            DRUG SCREEN FOR MEDICAL PURPOSES     ONLY.  IF CONFIRMATION IS NEEDED     FOR ANY PURPOSE, NOTIFY LAB     WITHIN 5 DAYS.                LOWEST DETECTABLE LIMITS     FOR URINE DRUG SCREEN     Drug Class       Cutoff (ng/mL)     Amphetamine      1000     Barbiturate      200     Benzodiazepine   200     Tricyclics       300     Opiates          300     Cocaine          300     THC              50  CBC  WITH DIFFERENTIAL     Status: Abnormal   Collection Time    06/18/12  6:25 PM      Result Value Range   WBC 9.3  4.0 - 10.5 K/uL   RBC 4.48  3.87 -  5.11 MIL/uL   Hemoglobin 12.6  12.0 - 15.0 g/dL   HCT 16.1  09.6 - 04.5 %   MCV 84.8  78.0 - 100.0 fL   MCH 28.1  26.0 - 34.0 pg   MCHC 33.2  30.0 - 36.0 g/dL   RDW 40.9 (*) 81.1 - 91.4 %   Platelets 257  150 - 400 K/uL   Neutrophils Relative % 42 (*) 43 - 77 %   Neutro Abs 3.9  1.7 - 7.7 K/uL   Lymphocytes Relative 48 (*) 12 - 46 %   Lymphs Abs 4.4 (*) 0.7 - 4.0 K/uL   Monocytes Relative 7  3 - 12 %   Monocytes Absolute 0.6  0.1 - 1.0 K/uL   Eosinophils Relative 4  0 - 5 %   Eosinophils Absolute 0.3  0.0 - 0.7 K/uL   Basophils Relative 0  0 - 1 %   Basophils Absolute 0.0  0.0 - 0.1 K/uL  COMPREHENSIVE METABOLIC PANEL     Status: Abnormal   Collection Time    06/18/12  6:25 PM      Result Value Range   Sodium 138  135 - 145 mEq/L   Potassium 3.2 (*) 3.5 - 5.1 mEq/L   Chloride 104  96 - 112 mEq/L   CO2 22  19 - 32 mEq/L   Glucose, Bld 80  70 - 99 mg/dL   BUN 11  6 - 23 mg/dL   Creatinine, Ser 7.82  0.50 - 1.10 mg/dL   Calcium 8.7  8.4 - 95.6 mg/dL   Total Protein 6.6  6.0 - 8.3 g/dL   Albumin 3.8  3.5 - 5.2 g/dL   AST 22  0 - 37 U/L   ALT 20  0 - 35 U/L   Alkaline Phosphatase 59  39 - 117 U/L   Total Bilirubin 0.1 (*) 0.3 - 1.2 mg/dL   GFR calc non Af Amer >90  >90 mL/min   GFR calc Af Amer >90  >90 mL/min   Comment:            The eGFR has been calculated     using the CKD EPI equation.     This calculation has not been     validated in all clinical     situations.     eGFR's persistently     <90 mL/min signify     possible Chronic Kidney Disease.  ACETAMINOPHEN LEVEL     Status: None   Collection Time    06/18/12  6:25 PM      Result Value Range   Acetaminophen (Tylenol), Serum <15.0  10 - 30 ug/mL   Comment:            THERAPEUTIC CONCENTRATIONS VARY     SIGNIFICANTLY. A RANGE OF 10-30     ug/mL MAY BE AN EFFECTIVE     CONCENTRATION FOR MANY PATIENTS.     HOWEVER, SOME ARE BEST TREATED     AT CONCENTRATIONS OUTSIDE THIS     RANGE.     ACETAMINOPHEN  CONCENTRATIONS     >150 ug/mL AT 4 HOURS AFTER     INGESTION AND >50 ug/mL AT 12     HOURS AFTER INGESTION  ARE     OFTEN ASSOCIATED WITH TOXIC     REACTIONS.  ETHANOL     Status: None   Collection Time    06/18/12  6:25 PM      Result Value Range   Alcohol, Ethyl (B) <11  0 - 11 mg/dL   Comment:            LOWEST DETECTABLE LIMIT FOR     SERUM ALCOHOL IS 11 mg/dL     FOR MEDICAL PURPOSES ONLY    Physical Findings: AIMS: Facial and Oral Movements Muscles of Facial Expression: None, normal Lips and Perioral Area: None, normal Jaw: None, normal Tongue: None, normal,Extremity Movements Upper (arms, wrists, hands, fingers): None, normal Lower (legs, knees, ankles, toes): None, normal, Trunk Movements Neck, shoulders, hips: None, normal, Overall Severity Severity of abnormal movements (highest score from questions above): None, normal Incapacitation due to abnormal movements: None, normal Patient's awareness of abnormal movements (rate only patient's report): No Awareness, Dental Status Current problems with teeth and/or dentures?: Yes Does patient usually wear dentures?: No  CIWA:  CIWA-Ar Total: 2 COWS:  COWS Total Score: 5  Treatment Plan Summary: Daily contact with patient to assess and evaluate symptoms and progress in treatment Medication management  Plan: Supportive approach/coping skills/relapse prevention. Encouraged out of room, participation in group sessions and application of coping skills when distressed. Will continue to monitor response to/adverse effects of medications in use to assure effectiveness. Continue to monitor mood, behavior and interaction with staff and other patients. Continue current plan of care.  Medical Decision Making Problem Points:  Established problem, stable/improving (1), Review of last therapy session (1) and Review of psycho-social stressors (1) Data Points:  Review of medication regiment & side effects (2) Review of new medications  or change in dosage (2)  I certify that inpatient services furnished can reasonably be expected to improve the patient's condition.   Armandina Stammer I, PMHNP-BC 06/20/2012, 1:09 PM

## 2012-06-20 NOTE — [facility] Group Notes (Signed)
[facility] LCSW Group Therapy  06/20/2012  1:15 PM   Type of Therapy:  Group Therapy  Participation Level:  Did Not Attend group on balance in life  Samantha Richardson, LCSWA 06/20/2012 2:30 PM

## 2012-06-20 NOTE — Progress Notes (Signed)
D: Patient appropriate and cooperative with staff and peers. Patient's affect/mood is anxious. She reported on the self inventory sheet that her sleep is fair, appetite/ability to pay attention is poor, and energy level is low. Patient rated depression "6" and feelings of hopelessness "8". Compliant with medication regimen, attending groups, and interacting with peers in the milieu.  A: Support and encouragement provided to patient. Administered scheduled medications per ordering MD. Monitor Q15 minute checks for safety.  R: Patient receptive. Denies SI/HI/AVH. Patient remains safe on the unit.

## 2012-06-20 NOTE — Tx Team (Addendum)
Interdisciplinary Treatment Plan Update (Adult)  Date: 06/21/12 Time Reviewed:  9:45 AM  Progress in Treatment: Attending groups: Yes Participating in groups:  Yes Taking medication as prescribed:  Yes Tolerating medication:  Yes Family/Significant othe contact made: Contact made with mother Patient understands diagnosis:  Yes Discussing patient identified problems/goals with staff:  Yes Medical problems stabilized or resolved:  Yes Denies suicidal/homicidal ideation: Yes Issues/concerns per patient self-inventory:  Yes Other:  New problem(s) identified: N/A  Discharge Plan or Barriers: Pt wants to follow up with [facility] and [facility] for further treatment  Reason for Continuation of Hospitalization: Anxiety Depression Medication Stabilization  Comments: N/A  Estimated length of stay: 3-5 days  For review of initial/current patient goals, please see plan of care.  Attendees: Patient:     Family:     Physician:  Dr. Dub Mikes 06/21/12 10:00 AM  Nursing:   Alease Frame, RN 06/21/12 10:00 AM  Clinical Social Worker:  Ronda Fairly, LCSWA 06/21/12 10:00 AM  Other:  Lowella Grip, RN 06/21/12 10:00 AM  Other:   06/21/12 10:00 AM  Other:  Liliane Bade, BSW (Transitional Care Manager) 06/21/12 10:00 AM  Other:     Other:    Other:    Other:    Other:    Other:    Other:     Scribe for Treatment Team:   Carney Bern, LCSWA   06/21/12 , 10:00 AM

## 2012-06-21 MED ORDER — GABAPENTIN 300 MG PO CAPS
300.0000 mg | ORAL_CAPSULE | Freq: Three times a day (TID) | ORAL | Status: DC
  Administered 2012-06-21 – 2012-06-24 (×9): 300 mg via ORAL
  Filled 2012-06-21: qty 1
  Filled 2012-06-21 (×2): qty 42
  Filled 2012-06-21 (×3): qty 1
  Filled 2012-06-21: qty 42
  Filled 2012-06-21 (×7): qty 1

## 2012-06-21 MED ORDER — QUETIAPINE FUMARATE 25 MG PO TABS
25.0000 mg | ORAL_TABLET | Freq: Three times a day (TID) | ORAL | Status: DC
  Administered 2012-06-21 – 2012-06-24 (×9): 25 mg via ORAL
  Filled 2012-06-21 (×4): qty 1
  Filled 2012-06-21: qty 42
  Filled 2012-06-21: qty 1
  Filled 2012-06-21: qty 42
  Filled 2012-06-21 (×4): qty 1
  Filled 2012-06-21: qty 42
  Filled 2012-06-21 (×4): qty 1

## 2012-06-21 NOTE — Progress Notes (Signed)
Patient ID: Samantha Richardson, female   DOB: October 24, 1980, 32 y.o.   MRN: [id number] [facility] MD Progress Note  06/21/2012 2:12 PM Samantha Richardson  MRN:  [id number]  Subjective: [name] reports that she got dizzy and anxious while in group sessions and has to go back to her room. Feels her heart racing as well. Says the reason that she was on Klonopin is because her heart races. Complains of feeling agitated, askd for something to take the age of things. Says she is only addicted to opiates, but has control of her antianxiety medications. Plan on going to [facility] after discharge. This time, determined to make it work because she has gone far already".   Diagnosis:   Axis I: Opioid dependence Axis II: Deferred Axis III:  Past Medical History  Diagnosis Date  . Asthma   . PTSD (post-traumatic stress disorder)   . Anxiety   . Depressed   . Bipolar 1 disorder   . Fibromyalgia   . Chronic back pain   . GERD (gastroesophageal reflux disease)    Axis IV: other psychosocial or environmental problems and Opioid dependence Axis V: 41-50 serious symptoms  ADL's:  Fairly intact.  Sleep: Good  Appetite:  Fair  Suicidal Ideation:  Plan:  Denies Intent:  Denies Means:  Denies Homicidal Ideation:  Plan:  Denies Intent:  denies Means:  denies  AEB (as evidenced by): Per patient's reports.  Psychiatric Specialty Exam: Review of Systems  Constitutional: Negative.   HENT: Negative.   Eyes: Negative.   Respiratory: Negative.   Cardiovascular: Negative.   Gastrointestinal: Negative.   Genitourinary: Negative.   Musculoskeletal: Negative.   Skin: Negative.   Neurological: Negative.   Endo/Heme/Allergies: Negative.   Psychiatric/Behavioral: Positive for substance abuse (Opioid dependence). Negative for depression, suicidal ideas, hallucinations and memory loss. The patient is nervous/anxious (Currently being stabilized with medication) and has insomnia (Currently being stabilized with  medication.).     Blood pressure 103/68, pulse 109, temperature 97.6 F (36.4 C), temperature source Oral, resp. rate 18, height 5\' 4"  (1.626 m), weight 79.379 kg (175 lb), last menstrual period 05/27/2012.Body mass index is 30.02 kg/(m^2).  General Appearance: Fairly Groomed  Patent attorney::  Fair  Speech:  Slow  Volume:  Decreased  Mood:  Anxious  Affect:  Flat  Thought Process:  Coherent  Orientation:  Full (Time, Place, and Person)  Thought Content:  Rumination  Suicidal Thoughts:  No  Homicidal Thoughts:  No  Memory:  Immediate;   Good Recent;   Good Remote;   Fair  Judgement:  Fair  Insight:  Fair  Psychomotor Activity:  Restlessness and Anxious, tremors  Concentration:  Fair  Recall:  Fair  Akathisia:  No  Handed:  Right  AIMS (if indicated):     Assets:  Desire for Improvement  Sleep:  Number of Hours: 5.25   Current Medications: Current Facility-Administered Medications  Medication Dose Route Frequency Provider Last Rate Last Dose  . acetaminophen (TYLENOL) tablet 650 mg  650 mg Oral Q6H PRN Kerry Hough, PA-C      . albuterol (PROVENTIL HFA;VENTOLIN HFA) 108 (90 BASE) MCG/ACT inhaler 2 puff  2 puff Inhalation Q6H PRN Kerry Hough, PA-C      . alum & mag hydroxide-simeth (MAALOX/MYLANTA) 200-200-20 MG/5ML suspension 30 mL  30 mL Oral Q4H PRN Kerry Hough, PA-C      . chlordiazePOXIDE (LIBRIUM) capsule 25 mg  25 mg Oral QID PRN Kerry Hough, PA-C  25 mg at 06/20/12 2211  . cloNIDine (CATAPRES) tablet 0.1 mg  0.1 mg Oral QID Kerry Hough, PA-C   0.1 mg at 06/21/12 1204   Followed by  . cloNIDine (CATAPRES) tablet 0.1 mg  0.1 mg Oral BH-qamhs Kerry Hough, PA-C       Followed by  . [START ON 06/24/2012] cloNIDine (CATAPRES) tablet 0.1 mg  0.1 mg Oral QAC breakfast Kerry Hough, PA-C      . dicyclomine (BENTYL) tablet 20 mg  20 mg Oral Q6H PRN Kerry Hough, PA-C      . DULoxetine (CYMBALTA) DR capsule 120 mg  120 mg Oral QHS Kerry Hough, PA-C    120 mg at 06/20/12 2211  . gabapentin (NEURONTIN) capsule 300 mg  300 mg Oral TID Sanjuana Kava, NP      . hydrOXYzine (ATARAX/VISTARIL) tablet 25 mg  25 mg Oral Q6H PRN Kerry Hough, PA-C   25 mg at 06/21/12 0756  . ibuprofen (ADVIL,MOTRIN) tablet 600 mg  600 mg Oral Q6H PRN Kerry Hough, PA-C      . loperamide (IMODIUM) capsule 2-4 mg  2-4 mg Oral PRN Kerry Hough, PA-C      . magnesium hydroxide (MILK OF MAGNESIA) suspension 30 mL  30 mL Oral Daily PRN Kerry Hough, PA-C      . methocarbamol (ROBAXIN) tablet 500 mg  500 mg Oral Q8H PRN Kerry Hough, PA-C      . naproxen (NAPROSYN) tablet 500 mg  500 mg Oral BID PRN Kerry Hough, PA-C   500 mg at 06/20/12 2211  . nicotine (NICODERM CQ - dosed in mg/24 hours) patch 21 mg  21 mg Transdermal Daily Rachael Fee, MD   21 mg at 06/21/12 0753  . ondansetron (ZOFRAN-ODT) disintegrating tablet 4 mg  4 mg Oral Q6H PRN Kerry Hough, PA-C      . traZODone (DESYREL) tablet 50 mg  50 mg Oral QHS,MR X 1 Kerry Hough, PA-C   50 mg at 06/20/12 2211    Lab Results:  No results found for this or any previous visit (from the past 48 hour(s)).  Physical Findings: AIMS: Facial and Oral Movements Muscles of Facial Expression: None, normal Lips and Perioral Area: None, normal Jaw: None, normal Tongue: None, normal,Extremity Movements Upper (arms, wrists, hands, fingers): None, normal Lower (legs, knees, ankles, toes): None, normal, Trunk Movements Neck, shoulders, hips: None, normal, Overall Severity Severity of abnormal movements (highest score from questions above): None, normal Incapacitation due to abnormal movements: None, normal Patient's awareness of abnormal movements (rate only patient's report): No Awareness, Dental Status Current problems with teeth and/or dentures?: Yes Does patient usually wear dentures?: No  CIWA:  CIWA-Ar Total: 1 COWS:  COWS Total Score: 5  Treatment Plan Summary: Daily contact with patient to  assess and evaluate symptoms and progress in treatment Medication management  Plan: Supportive approach/coping skills/relapse prevention. Increased Neurontin from 300 mg to 300 mg tid for anxiety/pain control. Add Seroquel 25 mg tid for anxiety. Encouraged out of room, participation in group sessions and application of coping skills when distressed. Will continue to monitor response to/adverse effects of medications in use to assure effectiveness. Continue to monitor mood, behavior and interaction with staff and other patients. Continue current plan of care.  Medical Decision Making Problem Points:  Established problem, stable/improving (1), Review of last therapy session (1) and Review of psycho-social stressors (1) Data Points:  Review  of medication regiment & side effects (2) Review of new medications or change in dosage (2)  I certify that inpatient services furnished can reasonably be expected to improve the patient's condition.   Armandina Stammer I, PMHNP-BC 06/21/2012, 2:12 PM

## 2012-06-21 NOTE — [facility] Group Notes (Signed)
Adult Psychoeducational Group Note  Date:  06/21/2012 Time:  2:07 PM  Group Topic/Focus:  Relapse Prevention Planning:   The focus of this group is to define relapse and discuss the need for planning to combat relapse.  Participation Level:  Active  Participation Quality:  Appropriate  Affect:  Appropriate  Cognitive:  Appropriate  Insight: Appropriate  Engagement in Group:  Engaged  Modes of Intervention:  Discussion, Education, Socialization and Support  Additional Comments:    Tania Ade 06/21/2012, 2:07 PM

## 2012-06-21 NOTE — Progress Notes (Signed)
D: Patient's affect/mood is anxious. Patient complained of anxiety 9/10. She reported on the self inventory sheet that her sleep is fair, appetite/ability to pay attention is improving and energy level is low. Patient rated depression "2" and feelings of hopelessness "0". She reported that changes she plans to make to better care for self are to say no, go to meetings and change lifestyle.   A: Support and encouragement provided to patient. Scheduled medications administered per MD orders. Maintain Q15 minute checks for safety.   R: Patient receptive. Denies SI/HI/AVH. Patient remains safe.

## 2012-06-21 NOTE — [facility] Group Notes (Signed)
[facility] LCSW Aftercare Discharge Planning Group Note   06/21/2012 8:45 AM   Participation Quality:  Minimal; in and out of group room  Mood/Affect:  Anxious  Depression Rating:  3  Anxiety Rating:  8  Thoughts of Suicide:  No Will you contract for safety?   NA  Current AVH:  No  Plan for Discharge/Comments:  Patient would like to discharge to inpatient substance abuse treatment program and follow up from there with halfway house placement. CSW to refer patient to [employer]:  [facility]  Supports: daughters, extended family  Dyane Dustman, Julious Payer

## 2012-06-21 NOTE — Progress Notes (Signed)
D.  Pt pleasant on approach, complaint of anxiety from withdrawal but no other symptoms present.  Positive for evening AA group, interacting appropriately with peers on unit.  Denies SI/HI/hallucinations at this time.  A.  Support and encouragement offered, medication given as ordered for withdrawal.  R.  Pt remains safe, will continue to monitor.

## 2012-06-21 NOTE — [facility] Group Notes (Signed)
[facility] LCSW Group Therapy  06/21/2012  1:15 PM  Type of Therapy:  Group Therapy  Participation Level:  Did Not Attend  Harrill, Catherine Campbell  

## 2012-06-22 MED ORDER — BUSPIRONE HCL 5 MG PO TABS
7.5000 mg | ORAL_TABLET | Freq: Two times a day (BID) | ORAL | Status: DC
  Administered 2012-06-22 – 2012-06-24 (×5): 7.5 mg via ORAL
  Filled 2012-06-22 (×6): qty 1.5
  Filled 2012-06-22: qty 42
  Filled 2012-06-22: qty 1.5
  Filled 2012-06-22: qty 42

## 2012-06-22 NOTE — Progress Notes (Signed)
D.  Pt pleasant on approach, in dayroom interacting with peer.  Denies SI/HI/hallcuinations at this time.  Pt was positive for evening AA group.  Denies complaints other than continued anxiety.  Pt has been visible and appropriate within milieu.  A.  Support and encouragement offered  R.  Pt remains safe on unit, will continue to monitor.

## 2012-06-22 NOTE — Progress Notes (Signed)
Adult Psychoeducational Group Note  Date:  06/22/2012 Time:  4:13 PM  Group Topic/Focus:  Healthy Communication:   The focus of this group is to discuss communication, barriers to communication, as well as healthy ways to communicate with others.  Participation Level:  Active  Participation Quality:  Appropriate, Sharing and Supportive  Affect:  Appropriate  Cognitive:  Appropriate  Insight: Appropriate  Engagement in Group:  Engaged and Supportive  Modes of Intervention:  Discussion, Education and Support  Additional Comments:  Pt was appropriate and participated.  Isla Pence M 06/22/2012, 4:13 PM

## 2012-06-22 NOTE — [facility] Group Notes (Signed)
[facility] Group Notes:  (Clinical Social Work)  06/22/2012     10-11AM  Summary of Progress/Problems:   The main focus of today's process group was for the patient to identify ways in which they have in the past sabotaged their own recovery. Motivational Interviewing was utilized to ask the group members what they get out of their substance use, and what reasons they may have for wanting to change.  The Stages of Change were explained using a handout, and patients identified where they currently are with regard to stages of change.  The patient expressed that she uses pills whenever someone calls to offer them to her, because she is trying to mask both physical and emotional pain.  She stated that in 3-4 years she lost a large number of people in death, including her boyfriend who overdosed, her ex-husband who was murdered, many family members, plus she lost her children to the custody of her mother.  She does not want drugs to control her life.  She feels that she is in preparation stage of change.  Type of Therapy:  Group Therapy - Process   Participation Level:  Active  Participation Quality:  Attentive  Affect:  Blunted and Depressed  Cognitive:  Appropriate and Oriented  Insight:  Developing/Improving  Engagement in Therapy:  Developing/Improving  Modes of Intervention:  Education, Support and Processing, Motivational Interviewing  Ambrose Mantle, LCSW 06/22/2012, 12:16 PM

## 2012-06-22 NOTE — Progress Notes (Signed)
D   Pt is anxious and depressed   She interacts well with others and attends and participates in group   She is having mild withdrawal including yawning and moderate anxiety   She attended group but was drowsy and did not share though she did try to stay awake and pay attention A   Verbal support given   Medications administered and effectiveness monitored   Q 15 min checks R   Pt safe at present

## 2012-06-22 NOTE — Progress Notes (Signed)
Psychoeducational Group Note  Date:  06/22/2012 Time: 1300  Group Topic/Focus:  Identifying Needs:   The focus of this group is to help patients identify their personal needs that have been historically problematic and identify healthy behaviors to address their needs.  Participation Level:  active Participation Quality: good Affect: flat Cognitive:    Insight:  good  Engagement in Group: engaged  Additional Comments:   PD RN [facility]

## 2012-06-22 NOTE — Progress Notes (Signed)
Psychoeducational Group Note  Date:  06/22/2012 Time:  0945 am  Group Topic/Focus:  Identifying Needs:   The focus of this group is to help patients identify their personal needs that have been historically problematic and identify healthy behaviors to address their needs.  Self inventory  Participation Level:  Minimal  Participation Quality:  Drowsy  Affect:  Lethargic  Cognitive:  Appropriate  Insight:  Improving  Engagement in Group:  Improving  Additional Comments:    Andrena Mews 06/22/2012,10:48 AM

## 2012-06-22 NOTE — Progress Notes (Signed)
[facility] MD Progress Note  06/22/2012 12:25 PM Samantha Richardson  MRN:  [id number] Subjective:  Samantha Richardson endorses that she is dealing with a lot of anxiety. She is taking the Neurontin and the Seroquel but is not getting much relief from those. She is worried about what is going to happen once she gets out of here. She states that she used to take benzodiazepines but understands she is not going to be able to take those as she plans to go to [facility] and [facility] house. State that her real problem are the opioids Diagnosis:  Opioid Dependence, Anxiety Disorder NOS  ADL's:  Intact  Sleep: Fair  Appetite:  Fair  Suicidal Ideation:  Plan:  denies Intent:  denies Means:  denies Homicidal Ideation:  Plan:  denies Intent:  denies Means:  denies AEB (as evidenced by):  Psychiatric Specialty Exam: Review of Systems  Constitutional: Negative.   HENT: Negative.   Eyes: Negative.   Respiratory: Negative.   Cardiovascular: Negative.   Gastrointestinal: Negative.   Genitourinary: Negative.   Musculoskeletal: Positive for myalgias.  Skin: Negative.   Neurological: Negative.   Endo/Heme/Allergies: Negative.   Psychiatric/Behavioral: Positive for substance abuse. The patient is nervous/anxious.     Blood pressure 119/83, pulse 103, temperature 97.6 F (36.4 C), temperature source Oral, resp. rate 18, height 5\' 4"  (1.626 m), weight 79.379 kg (175 lb), last menstrual period 05/27/2012.Body mass index is 30.02 kg/(m^2).  General Appearance: Fairly Groomed  Patent attorney::  Fair  Speech:  Clear and Coherent  Volume:  Normal  Mood:  Anxious and worried  Affect:  anxious  Thought Process:  Coherent and Goal Directed  Orientation:  Full (Time, Place, and Person)  Thought Content:  worries, concerns  Suicidal Thoughts:  No  Homicidal Thoughts:  No  Memory:  Immediate;   Fair Recent;   Fair Remote;   Fair  Judgement:  Fair  Insight:  Present  Psychomotor Activity:  Restlessness  Concentration:  Fair   Recall:  Fair  Akathisia:  No  Handed:  Right  AIMS (if indicated):     Assets:  Desire for Improvement  Sleep:  Number of Hours: 7   Current Medications: Current Facility-Administered Medications  Medication Dose Route Frequency Provider Last Rate Last Dose  . acetaminophen (TYLENOL) tablet 650 mg  650 mg Oral Q6H PRN Kerry Hough, PA-C      . albuterol (PROVENTIL HFA;VENTOLIN HFA) 108 (90 BASE) MCG/ACT inhaler 2 puff  2 puff Inhalation Q6H PRN Kerry Hough, PA-C      . alum & mag hydroxide-simeth (MAALOX/MYLANTA) 200-200-20 MG/5ML suspension 30 mL  30 mL Oral Q4H PRN Kerry Hough, PA-C      . busPIRone (BUSPAR) tablet 7.5 mg  7.5 mg Oral BID Rachael Fee, MD   7.5 mg at 06/22/12 1010  . chlordiazePOXIDE (LIBRIUM) capsule 25 mg  25 mg Oral QID PRN Kerry Hough, PA-C   25 mg at 06/22/12 1207  . cloNIDine (CATAPRES) tablet 0.1 mg  0.1 mg Oral BH-qamhs Spencer E Simon, PA-C   0.1 mg at 06/22/12 0751   Followed by  . [START ON 06/24/2012] cloNIDine (CATAPRES) tablet 0.1 mg  0.1 mg Oral QAC breakfast Kerry Hough, PA-C      . dicyclomine (BENTYL) tablet 20 mg  20 mg Oral Q6H PRN Kerry Hough, PA-C      . DULoxetine (CYMBALTA) DR capsule 120 mg  120 mg Oral QHS Kerry Hough, PA-C  120 mg at 06/21/12 2126  . gabapentin (NEURONTIN) capsule 300 mg  300 mg Oral TID Sanjuana Kava, NP   300 mg at 06/22/12 1203  . hydrOXYzine (ATARAX/VISTARIL) tablet 25 mg  25 mg Oral Q6H PRN Kerry Hough, PA-C   25 mg at 06/21/12 0756  . ibuprofen (ADVIL,MOTRIN) tablet 600 mg  600 mg Oral Q6H PRN Kerry Hough, PA-C      . loperamide (IMODIUM) capsule 2-4 mg  2-4 mg Oral PRN Kerry Hough, PA-C      . magnesium hydroxide (MILK OF MAGNESIA) suspension 30 mL  30 mL Oral Daily PRN Kerry Hough, PA-C      . methocarbamol (ROBAXIN) tablet 500 mg  500 mg Oral Q8H PRN Kerry Hough, PA-C      . naproxen (NAPROSYN) tablet 500 mg  500 mg Oral BID PRN Kerry Hough, PA-C   500 mg at  06/22/12 1208  . nicotine (NICODERM CQ - dosed in mg/24 hours) patch 21 mg  21 mg Transdermal Daily Rachael Fee, MD   21 mg at 06/22/12 0750  . ondansetron (ZOFRAN-ODT) disintegrating tablet 4 mg  4 mg Oral Q6H PRN Kerry Hough, PA-C      . QUEtiapine (SEROQUEL) tablet 25 mg  25 mg Oral TID Sanjuana Kava, NP   25 mg at 06/22/12 1203  . traZODone (DESYREL) tablet 50 mg  50 mg Oral QHS,MR X 1 Kerry Hough, PA-C   50 mg at 06/21/12 2126    Lab Results: No results found for this or any previous visit (from the past 48 hour(s)).  Physical Findings: AIMS: Facial and Oral Movements Muscles of Facial Expression: None, normal Lips and Perioral Area: None, normal Jaw: None, normal Tongue: None, normal,Extremity Movements Upper (arms, wrists, hands, fingers): None, normal Lower (legs, knees, ankles, toes): None, normal, Trunk Movements Neck, shoulders, hips: None, normal, Overall Severity Severity of abnormal movements (highest score from questions above): None, normal Incapacitation due to abnormal movements: None, normal Patient's awareness of abnormal movements (rate only patient's report): No Awareness, Dental Status Current problems with teeth and/or dentures?: Yes Does patient usually wear dentures?: No  CIWA:  CIWA-Ar Total: 5 COWS:  COWS Total Score: 4  Treatment Plan Summary: Daily contact with patient to assess and evaluate symptoms and progress in treatment Medication management  Plan: Supportive approach/coping skills/relapse prevention           Trial with Buspar 7.5 mg BID  Medical Decision Making Problem Points:  Review of psycho-social stressors (1) Data Points:  Review of medication regiment & side effects (2) Review of new medications or change in dosage (2)  I certify that inpatient services furnished can reasonably be expected to improve the patient's condition.   Jannessa Ogden A 06/22/2012, 12:25 PM

## 2012-06-22 NOTE — Progress Notes (Signed)
Patient did attend the evening speaker AA meeting.  

## 2012-06-23 NOTE — Progress Notes (Signed)
Psychoeducational Group Note  Date: 06/23/2012 Time: 1300  Group Topic/Focus:  Making Healthy Choices:   The focus of this group is to help patients identify negative/unhealthy choices they were using prior to admission and identify positive/healthier coping strategies to replace them upon discharge.  Participation Level:  Active  Participation Quality:  Appropriate  Affect:  Appropriate  Cognitive:  Appropriate  Insight:  Engaged  Engagement in Group:  Engaged  Additional Comments:    06/23/2012,3:04 PM Oluwadarasimi Redmon, Joie Bimler

## 2012-06-23 NOTE — Progress Notes (Signed)
Psychoeducational Group Note  Date:  06/23/2012 Time:  0945 am  Group Topic/Focus:  Making Healthy Choices:   The focus of this group is to help patients identify negative/unhealthy choices they were using prior to admission and identify positive/healthier coping strategies to replace them upon discharge.  Participation Level:  Active  Participation Quality:  Appropriate, Attentive, Sharing and Supportive  Affect:  Appropriate  Cognitive:  Alert and Appropriate  Insight:  Developing/Improving and Engaged  Engagement in Group:  Developing/Improving and Engaged  Additional Comments:    Andrena Mews 06/23/2012, 10:29 AM

## 2012-06-23 NOTE — Progress Notes (Signed)
[facility] MD Progress Note  06/23/2012 1:55 PM Anely MADDALYNN BARNARD  MRN:  [id number] Subjective:  Evetta is working on getting her life back together. She would like to go to [facility] and from then go to the [facility]. States that she needs that structure to be able to make it out there. She states that [facility] worked for her ex and that he was in worst shape than her so she is hopeful this is going to work out. Her pain is going to be one of her triggers but she knows she is not going to be able to handle narcotics. Still dealing with anxiety  Diagnosis:  Opioid Dependence/Depressive Disorder NOS/Anxiety Disorder NOS  ADL's:  Intact  Sleep: Fair  Appetite:  Fair  Suicidal Ideation:  Plan:  denies Intent:  denies Means:  denies Homicidal Ideation:  Plan:  denies Intent:  denies Means:  denies AEB (as evidenced by):  Psychiatric Specialty Exam: Review of Systems  Constitutional: Negative.   HENT: Negative.   Eyes: Negative.   Respiratory: Negative.   Cardiovascular: Negative.   Gastrointestinal: Negative.   Genitourinary: Negative.   Musculoskeletal: Positive for myalgias.  Skin: Negative.   Neurological: Negative.   Endo/Heme/Allergies: Negative.   Psychiatric/Behavioral: Positive for depression and substance abuse. The patient is nervous/anxious.     Blood pressure 120/80, pulse 116, temperature 97.6 F (36.4 C), temperature source Oral, resp. rate 18, height 5\' 4"  (1.626 m), weight 79.379 kg (175 lb), last menstrual period 05/27/2012.Body mass index is 30.02 kg/(m^2).  General Appearance: Fairly Groomed  Patent attorney::  Fair  Speech:  Clear and Coherent  Volume:  Normal  Mood:  Anxious  Affect:  Anxious  Thought Process:  Coherent and Goal Directed  Orientation:  Full (Time, Place, and Person)  Thought Content:  worries, concerns  Suicidal Thoughts:  No  Homicidal Thoughts:  No  Memory:  Immediate;   Fair Recent;   Fair Remote;   Fair  Judgement:  Fair  Insight:  Present   Psychomotor Activity:  Restlessness  Concentration:  Fair  Recall:  Fair  Akathisia:  No  Handed:  Right  AIMS (if indicated):     Assets:  Desire for Improvement  Sleep:  Number of Hours: 6   Current Medications: Current Facility-Administered Medications  Medication Dose Route Frequency Provider Last Rate Last Dose  . acetaminophen (TYLENOL) tablet 650 mg  650 mg Oral Q6H PRN Kerry Hough, PA-C      . albuterol (PROVENTIL HFA;VENTOLIN HFA) 108 (90 BASE) MCG/ACT inhaler 2 puff  2 puff Inhalation Q6H PRN Kerry Hough, PA-C      . alum & mag hydroxide-simeth (MAALOX/MYLANTA) 200-200-20 MG/5ML suspension 30 mL  30 mL Oral Q4H PRN Kerry Hough, PA-C      . busPIRone (BUSPAR) tablet 7.5 mg  7.5 mg Oral BID Rachael Fee, MD   7.5 mg at 06/23/12 0754  . chlordiazePOXIDE (LIBRIUM) capsule 25 mg  25 mg Oral QID PRN Kerry Hough, PA-C   25 mg at 06/23/12 1212  . [START ON 06/24/2012] cloNIDine (CATAPRES) tablet 0.1 mg  0.1 mg Oral QAC breakfast Kerry Hough, PA-C      . dicyclomine (BENTYL) tablet 20 mg  20 mg Oral Q6H PRN Kerry Hough, PA-C      . DULoxetine (CYMBALTA) DR capsule 120 mg  120 mg Oral QHS Kerry Hough, PA-C   120 mg at 06/22/12 2142  . gabapentin (NEURONTIN) capsule 300 mg  300 mg Oral TID Sanjuana Kava, NP   300 mg at 06/23/12 1212  . hydrOXYzine (ATARAX/VISTARIL) tablet 25 mg  25 mg Oral Q6H PRN Kerry Hough, PA-C   25 mg at 06/21/12 0756  . ibuprofen (ADVIL,MOTRIN) tablet 600 mg  600 mg Oral Q6H PRN Kerry Hough, PA-C      . loperamide (IMODIUM) capsule 2-4 mg  2-4 mg Oral PRN Kerry Hough, PA-C      . magnesium hydroxide (MILK OF MAGNESIA) suspension 30 mL  30 mL Oral Daily PRN Kerry Hough, PA-C      . methocarbamol (ROBAXIN) tablet 500 mg  500 mg Oral Q8H PRN Kerry Hough, PA-C      . naproxen (NAPROSYN) tablet 500 mg  500 mg Oral BID PRN Kerry Hough, PA-C   500 mg at 06/22/12 1208  . nicotine (NICODERM CQ - dosed in mg/24 hours) patch  21 mg  21 mg Transdermal Daily Rachael Fee, MD   21 mg at 06/23/12 0753  . ondansetron (ZOFRAN-ODT) disintegrating tablet 4 mg  4 mg Oral Q6H PRN Kerry Hough, PA-C      . QUEtiapine (SEROQUEL) tablet 25 mg  25 mg Oral TID Sanjuana Kava, NP   25 mg at 06/23/12 1214  . traZODone (DESYREL) tablet 50 mg  50 mg Oral QHS,MR X 1 Spencer E Simon, PA-C   50 mg at 06/22/12 2240    Lab Results: No results found for this or any previous visit (from the past 48 hour(s)).  Physical Findings: AIMS: Facial and Oral Movements Muscles of Facial Expression: None, normal Lips and Perioral Area: None, normal Jaw: None, normal Tongue: None, normal,Extremity Movements Upper (arms, wrists, hands, fingers): None, normal Lower (legs, knees, ankles, toes): None, normal, Trunk Movements Neck, shoulders, hips: None, normal, Overall Severity Severity of abnormal movements (highest score from questions above): None, normal Incapacitation due to abnormal movements: None, normal Patient's awareness of abnormal movements (rate only patient's report): No Awareness, Dental Status Current problems with teeth and/or dentures?: Yes Does patient usually wear dentures?: No  CIWA:  CIWA-Ar Total: 3 COWS:  COWS Total Score: 2  Treatment Plan Summary: Daily contact with patient to assess and evaluate symptoms and progress in treatment Medication management  Plan:m Supportive approach/coping skills/relapse prevention              Continue and complete detox              Continue to reassess co morbidities              Facilitate placement              Medical Decision Making Problem Points:  Review of last therapy session (1) and Review of psycho-social stressors (1) Data Points:  Review of medication regiment & side effects (2)  I certify that inpatient services furnished can reasonably be expected to improve the patient's condition.   Alonte Wulff A 06/23/2012, 1:55 PM

## 2012-06-23 NOTE — Progress Notes (Signed)
D.  Pt pleasant on approach, some complaint of chronic back pain.  Positive for evening AA group, interacting appropriately within milieu.  Denies SI/HI/hallucinations at this time.  Continued anxiety.  A.  Support and encouragement offered  R.  Pt remains safe on unit, will continue to monitor.

## 2012-06-23 NOTE — Progress Notes (Signed)
D   Pt is bright and pleasant this morning   She reports feeling much better and sleeping well   Her appetite is poor  Depression and hopelessness have decreased   She said the nicotine patch does little for her cravings but does keep her from being so irritable A   Verbal support given  Medications administered and effectiveness monitored  Q 15 min checks R   Pt safe at present

## 2012-06-23 NOTE — Progress Notes (Signed)
Adult Psychoeducational Group Note  Date: 06/23/2012  Time: 3:37 PM  Group Topic/Focus:  Conflict Resolution: The focus of this group is to discuss the conflict resolution process and how it may be used upon discharge.  Participation Level: Active  Participation Quality: Appropriate, Sharing and Supportive  Affect: Appropriate  Cognitive: Appropriate  Insight: Appropriate  Engagement in Group: Engaged and Supportive  Modes of Intervention: Discussion, Education and Support  Additional Comments: Pt was appropriate, no signs of stress or discomfort.  Shanara Schnieders M  06/23/2012, 3:37 PM  

## 2012-06-23 NOTE — Progress Notes (Signed)
Patient did attend the evening speaker AA meeting. Pt slept throughout meeting.

## 2012-06-23 NOTE — [facility] Group Notes (Addendum)
[facility] Group Notes:  (Clinical Social Work)  06/23/2012  10:00-11:00AM  Summary of Progress/Problems:   The main focus of today's process group was to   identify the patient's current support system and decide on other supports that can be put in place.  Four definitions/levels of support were discussed and an exercise was utilized to show how much stronger we become with additional supports.  An emphasis was placed on using counselor, doctor, therapy groups, 12-step groups, and problem-specific support groups to expand supports, as well as doing something different than has been done before. The patient expressed a willingness to add rehab through [facility] at discharge, then plans to go to [facility] house and attend church and NA.  She was much improved in affect and mood from yesterday.  Type of Therapy:  Process Group with Motivational Interviewing  Participation Level:  Active  Participation Quality:  Appropriate, Attentive, Sharing and Supportive  Affect:  Blunted  Cognitive:  Alert, Appropriate and Oriented  Insight:  Engaged  Engagement in Therapy:  Engaged  Modes of Intervention:   Education, Support and Processing, Activity  [employer], LCSW 06/23/2012, 9:28 AM

## 2012-06-24 DIAGNOSIS — F1120 Opioid dependence, uncomplicated: Principal | ICD-10-CM

## 2012-06-24 MED ORDER — IBUPROFEN 800 MG PO TABS
800.0000 mg | ORAL_TABLET | Freq: Every day | ORAL | Status: DC | PRN
  Filled 2012-06-24: qty 10

## 2012-06-24 MED ORDER — GABAPENTIN 300 MG PO CAPS: 300.0000 mg | ORAL_CAPSULE | Freq: Three times a day (TID) | ORAL | Status: AC

## 2012-06-24 MED ORDER — ALBUTEROL SULFATE HFA 108 (90 BASE) MCG/ACT IN AERS: 2.0000 | INHALATION_SPRAY | Freq: Four times a day (QID) | RESPIRATORY_TRACT | Status: AC | PRN

## 2012-06-24 MED ORDER — BUSPIRONE HCL 7.5 MG PO TABS: 7.5000 mg | ORAL_TABLET | Freq: Two times a day (BID) | ORAL | Status: AC

## 2012-06-24 MED ORDER — QUETIAPINE FUMARATE 25 MG PO TABS: 25.0000 mg | ORAL_TABLET | Freq: Three times a day (TID) | ORAL | Status: DC

## 2012-06-24 MED ORDER — IBUPROFEN 200 MG PO TABS: 800.0000 mg | ORAL_TABLET | Freq: Every day | ORAL | Status: DC | PRN

## 2012-06-24 MED ORDER — TRAZODONE HCL 50 MG PO TABS: 50.0000 mg | ORAL_TABLET | Freq: Every evening | ORAL | Status: AC | PRN

## 2012-06-24 MED ORDER — DULOXETINE HCL 60 MG PO CPEP: 120.0000 mg | ORAL_CAPSULE | Freq: Every day | ORAL | Status: AC
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
## 2012-06-24 NOTE — Tx Team (Signed)
Interdisciplinary Treatment Plan Update (Adult)  Date: 06/24/2012  Time Reviewed: 9:45 AM   Progress in Treatment: Attending groups:  Participating in groups:  Taking medication as prescribed:  Yes Tolerating medication:  Yes Family/Significant othe contact made: Yes Patient understands diagnosis: Yes Discussing patient identified problems/goals with staff: Yes Medical problems stabilized or resolved:  Yes Denies suicidal/homicidal ideation: Yes Patient has not harmed self or Others: Yes  New problem(s) identified: None Identified  Discharge Plan or Barriers:  Pt will follow up at [facility] for further treatment  Additional comments: N/A  Reason for Continuation of Hospitalization: Stable to d/c  Estimated length of stay: D/C today  For review of initial/current patient goals, please see plan of care.  Attendees: Patient:     Family:     Physician:  Geoffery Lyons 06/24/2012 9:42 AM   Nursing:   Liborio Nixon, RN 06/24/2012 9:42 AM   Clinical Social Worker Horst Ostermiller Horton LCSWA 06/24/2012 9:42 AM   Other:  Liliane Bade, Community Care Coordinator 06/24/2012 9:42 AM   Other:  Estell Harpin, Community Care Coordinator  06/24/2012 9:42 AM   Other:  Caroll Rancher, MHT 06/24/2012 9:42 AM   Other:  Hadassah Pais, RN  06/24/2012 9:42 AM    Scribe for Treatment Team:   Reyes Ivan, LCSWA 06/24/2012  10:00 AM

## 2012-06-24 NOTE — [facility] Suicide Risk Assessment (Signed)
Suicide Risk Assessment  Discharge Assessment     Demographic Factors:  Caucasian  Mental Status Per Nursing Assessment::   On Admission:  Self-harm thoughts  Current Mental Status by Physician: In full contact with reality. There are no suicidal ideas, plans or intent. Mood is worried. Affect is appropriate. She is going to [facility] today. She would like to pursue further long term rehab after [facility]   Loss Factors: Decline in physical health  Historical Factors: NA  Risk Reduction Factors:   Sense of responsibility to family and Living with another person, especially a relative  Continued Clinical Symptoms:  Depression:   Comorbid alcohol abuse/dependence Alcohol/Substance Abuse/Dependencies  Cognitive Features That Contribute To Risk:  Closed-mindedness Thought constriction (tunnel vision)    Suicide Risk:  Minimal: No identifiable suicidal ideation.  Patients presenting with no risk factors but with morbid ruminations; may be classified as minimal risk based on the severity of the depressive symptoms  Discharge Diagnoses:   AXIS I:  Major Depression recurrent, Opioid Dependence AXIS II:  Deferred AXIS III:   Past Medical History  Diagnosis Date  . Asthma   . PTSD (post-traumatic stress disorder)   . Anxiety   . Depressed   . Bipolar 1 disorder   . Fibromyalgia   . Chronic back pain   . GERD (gastroesophageal reflux disease)    AXIS IV:  other psychosocial or environmental problems AXIS V:  61-70 mild symptoms  Plan Of Care/Follow-up recommendations:  Activity:  as tolerated Diet:  regular To be admitted to [facility] today Is patient on multiple antipsychotic therapies at discharge:  No   Has Patient had three or more failed trials of antipsychotic monotherapy by history:  No  Recommended Plan for Multiple Antipsychotic Therapies: N/A   Hamp Moreland A 06/24/2012, 11:29 AM

## 2012-06-24 NOTE — Progress Notes (Signed)
[facility] Adult Case Management Discharge Plan :  Will you be returning to the same living situation after discharge: No. Pt going to [facility] for treatment and is hopeful she can get into [facility] for housing upon d/c from [facility]. At discharge, do you have transportation home?:Yes,  [facility] to pick pt up Do you have the ability to pay for your medications:Yes,  access to meds  Release of information consent forms completed and in the chart;  Patient's signature needed at discharge.  Patient to Follow up at: Follow-up Information   Follow up with [facility] On 06/24/2012. (Will be picked up at .  Auth #)    Contact information:   [address] [place], [place] [address] Phone: [phone] Fax: [phone]      Patient denies SI/HI:   Yes,  denies SI/HI    Safety Planning and Suicide Prevention discussed:  Yes,  discussed with pt and mother (see suicide prevention note)  Horton, Salome Arnt 06/24/2012, 11:02 AM

## 2012-06-24 NOTE — [facility] Group Notes (Signed)
[facility] LCSW Aftercare Discharge Planning Group Note   06/24/2012 8:45 AM  Participation Quality:  Alert and Appropriate   Mood/Affect:  Appropriate  Depression Rating:  4  Anxiety Rating:  7  Thoughts of Suicide:  Pt denies SI/HI  Will you contract for safety?   Yes  Current AVH:  No  Plan for Discharge/Comments:  Pt attended discharge planning group and actively participated in group.  CSW provided pt with today's workbook.  Pt states that her plan still remains to follow up at [facility] for more treatment and [facility] upon d/c from [facility].  CSW will assess if pt is able to go to [facility] today. No further needs voiced by pt at this time.    Transportation Means: [facility] to transport pt  Supports: Pt's mother is supportive  Reyes Ivan, LCSWA 06/24/2012 9:21 AM

## 2012-06-24 NOTE — Progress Notes (Signed)
Pt was discharged to [facility] today.  She denied any S/I H/I or A/V hallucinations.    She was given f/u appointment, rx, sample medications, hotline info booklet.  She voiced understanding to all instructions provided.  She declined the need for smoking cessation materials.  She removed her nicotine patch before she left. 

## 2012-06-24 NOTE — Progress Notes (Signed)
Adult Psychoeducational Group Note  Date:  06/24/2012 Time:  10:49 AM  Group Topic/Focus:  Wellness Toolbox:   The focus of this group is to discuss various aspects of wellness, balancing those aspects and exploring ways to increase the ability to experience wellness.  Patients will create a wellness toolbox for use upon discharge.  Participation Level:  Minimal  Participation Quality:  Appropriate and Redirectable  Affect:  Appropriate  Cognitive:  Appropriate  Insight: Appropriate  Engagement in Group:  Improving  Modes of Intervention:  Discussion  Additional Comments:  Pt was appropriate and sharing while attending group. Pt had to be redirected due to side conversations during group. Pt shared that that attending church and fixing her disability are two goals for her wellness.   Sharyn Lull 06/24/2012, 10:49 AM

## 2012-06-27 NOTE — Progress Notes (Signed)
Patient Discharge Instructions:  After Visit Summary (AVS):   Faxed to:  06/27/12 Discharge Summary Note:   Faxed to:  06/27/12 Psychiatric Admission Assessment Note:   Faxed to:  06/27/12 Suicide Risk Assessment - Discharge Assessment:   Faxed to:  06/27/12 Faxed/Sent to the Next Level Care provider:  06/27/12 Faxed to [facility] @ [phone]  Jerelene Redden, 06/27/2012, 3:18 PM

## 2012-10-15 ENCOUNTER — Emergency Department (HOSPITAL_COMMUNITY)
Admission: EM | Admit: 2012-10-15 | Discharge: 2012-10-16 | Payer: Self-pay | Attending: Emergency Medicine | Admitting: Emergency Medicine

## 2012-10-15 ENCOUNTER — Encounter (HOSPITAL_COMMUNITY): Payer: Self-pay | Admitting: *Deleted

## 2012-10-15 DIAGNOSIS — T7411XA Adult physical abuse, confirmed, initial encounter: Secondary | ICD-10-CM | POA: Insufficient documentation

## 2012-10-15 DIAGNOSIS — R42 Dizziness and giddiness: Secondary | ICD-10-CM | POA: Insufficient documentation

## 2012-10-15 DIAGNOSIS — F17200 Nicotine dependence, unspecified, uncomplicated: Secondary | ICD-10-CM | POA: Insufficient documentation

## 2012-10-15 DIAGNOSIS — S9000XA Contusion of unspecified ankle, initial encounter: Secondary | ICD-10-CM | POA: Insufficient documentation

## 2012-10-15 DIAGNOSIS — Z79899 Other long term (current) drug therapy: Secondary | ICD-10-CM | POA: Insufficient documentation

## 2012-10-15 DIAGNOSIS — IMO0002 Reserved for concepts with insufficient information to code with codable children: Secondary | ICD-10-CM | POA: Insufficient documentation

## 2012-10-15 DIAGNOSIS — F411 Generalized anxiety disorder: Secondary | ICD-10-CM | POA: Insufficient documentation

## 2012-10-15 DIAGNOSIS — J45909 Unspecified asthma, uncomplicated: Secondary | ICD-10-CM | POA: Insufficient documentation

## 2012-10-15 DIAGNOSIS — T07XXXA Unspecified multiple injuries, initial encounter: Secondary | ICD-10-CM | POA: Insufficient documentation

## 2012-10-15 DIAGNOSIS — Z8659 Personal history of other mental and behavioral disorders: Secondary | ICD-10-CM | POA: Insufficient documentation

## 2012-10-15 HISTORY — DX: Social phobia, unspecified: F40.10

## 2012-10-15 HISTORY — DX: Panic disorder (episodic paroxysmal anxiety): F41.0
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
## 2012-10-16 ENCOUNTER — Emergency Department (HOSPITAL_COMMUNITY): Payer: Self-pay

## 2012-10-16 ENCOUNTER — Encounter (HOSPITAL_COMMUNITY): Payer: Self-pay | Admitting: *Deleted

## 2012-10-16 ENCOUNTER — Encounter (HOSPITAL_COMMUNITY): Payer: Self-pay | Admitting: Radiology

## 2012-10-16 LAB — POCT I-STAT, CHEM 8
BUN: 8 mg/dL (ref 6–23)
Chloride: 107 mEq/L (ref 96–112)
Creatinine, Ser: 0.9 mg/dL (ref 0.50–1.10)
Glucose, Bld: 106 mg/dL — ABNORMAL HIGH (ref 70–99)
Potassium: 4.1 mEq/L (ref 3.5–5.1)
Sodium: 141 mEq/L (ref 135–145)

## 2012-10-16 LAB — ETHANOL: Alcohol, Ethyl (B): <11 mg/dL (ref 0–11)

## 2012-10-16 LAB — CBC
HCT: 40.5 % (ref 36.0–46.0)
Hemoglobin: 13.2 g/dL (ref 12.0–15.0)
RBC: 4.8 MIL/uL (ref 3.87–5.11)
WBC: 10.8 10*3/uL — ABNORMAL HIGH (ref 4.0–10.5)

## 2012-10-16 MED ORDER — IBUPROFEN 800 MG PO TABS
800.0000 mg | ORAL_TABLET | Freq: Once | ORAL | Status: AC
  Administered 2012-10-16: 800 mg via ORAL
  Filled 2012-10-16: qty 1

## 2012-10-16 MED ORDER — IBUPROFEN 600 MG PO TABS: 600.0000 mg | ORAL_TABLET | Freq: Four times a day (QID) | ORAL | Status: DC | PRN

## 2012-10-16 NOTE — ED Notes (Signed)
Patient discharged in [facility] Department custody

## 2012-10-16 NOTE — ED Notes (Signed)
Patient transported to radiology for x-rays and CT scans  
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
## 2012-10-16 NOTE — ED Notes (Signed)
Discharge instructions and information reviewed/given. Patient verbalized understanding.

## 2012-10-16 NOTE — ED Notes (Signed)
Patient requesting something to eat & drink; ok per EDP. Provided patient with [place] sandwich bag and coke. Tolerating well.
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
## 2012-10-16 NOTE — ED Notes (Signed)
Sheriff deputy at bedside. 
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
## 2013-12-08 ENCOUNTER — Encounter (HOSPITAL_COMMUNITY): Payer: Self-pay | Admitting: *Deleted

## 2014-05-17 ENCOUNTER — Encounter (HOSPITAL_COMMUNITY): Payer: Self-pay | Admitting: Emergency Medicine

## 2014-05-17 ENCOUNTER — Emergency Department (HOSPITAL_COMMUNITY)
Admission: EM | Admit: 2014-05-17 | Discharge: 2014-05-17 | Disposition: A | Discharge status: Home / Self Care | Payer: 59 | Attending: Emergency Medicine | Admitting: Emergency Medicine

## 2014-05-17 DIAGNOSIS — F319 Bipolar disorder, unspecified: Secondary | ICD-10-CM | POA: Insufficient documentation

## 2014-05-17 DIAGNOSIS — J45909 Unspecified asthma, uncomplicated: Secondary | ICD-10-CM | POA: Insufficient documentation

## 2014-05-17 DIAGNOSIS — I509 Heart failure, unspecified: Secondary | ICD-10-CM | POA: Diagnosis not present

## 2014-05-17 DIAGNOSIS — M797 Fibromyalgia: Secondary | ICD-10-CM | POA: Insufficient documentation

## 2014-05-17 DIAGNOSIS — K029 Dental caries, unspecified: Secondary | ICD-10-CM | POA: Diagnosis not present

## 2014-05-17 DIAGNOSIS — Z791 Long term (current) use of non-steroidal anti-inflammatories (NSAID): Secondary | ICD-10-CM | POA: Insufficient documentation

## 2014-05-17 DIAGNOSIS — F4310 Post-traumatic stress disorder, unspecified: Secondary | ICD-10-CM | POA: Insufficient documentation

## 2014-05-17 DIAGNOSIS — R000 Tachycardia, unspecified: Secondary | ICD-10-CM | POA: Insufficient documentation

## 2014-05-17 DIAGNOSIS — R220 Localized swelling, mass and lump, head: Secondary | ICD-10-CM | POA: Diagnosis present

## 2014-05-17 DIAGNOSIS — Z720 Tobacco use: Secondary | ICD-10-CM | POA: Diagnosis not present

## 2014-05-17 DIAGNOSIS — Z79899 Other long term (current) drug therapy: Secondary | ICD-10-CM | POA: Diagnosis not present

## 2014-05-17 DIAGNOSIS — K047 Periapical abscess without sinus: Secondary | ICD-10-CM

## 2014-05-17 HISTORY — DX: Heart failure, unspecified: I50.9

## 2014-05-17 MED ORDER — CEFTRIAXONE SODIUM 1 G IJ SOLR
1.0000 g | Freq: Once | INTRAMUSCULAR | Status: AC
  Administered 2014-05-17: 1 g via INTRAMUSCULAR
  Filled 2014-05-17: qty 10

## 2014-05-17 MED ORDER — TRAMADOL HCL 50 MG PO TABS: ORAL_TABLET | ORAL | Status: DC

## 2014-05-17 MED ORDER — PROMETHAZINE HCL 12.5 MG PO TABS
12.5000 mg | ORAL_TABLET | Freq: Once | ORAL | Status: AC
  Administered 2014-05-17: 12.5 mg via ORAL
  Filled 2014-05-17: qty 1

## 2014-05-17 MED ORDER — IBUPROFEN 800 MG PO TABS: 800.0000 mg | ORAL_TABLET | Freq: Three times a day (TID) | ORAL | Status: AC

## 2014-05-17 MED ORDER — TRAMADOL HCL 50 MG PO TABS
100.0000 mg | ORAL_TABLET | Freq: Once | ORAL | Status: AC
  Administered 2014-05-17: 100 mg via ORAL
  Filled 2014-05-17: qty 2

## 2014-05-17 MED ORDER — AMOXICILLIN 500 MG PO CAPS: 500.0000 mg | ORAL_CAPSULE | Freq: Three times a day (TID) | ORAL | Status: AC

## 2014-05-17 MED ORDER — IBUPROFEN 800 MG PO TABS
800.0000 mg | ORAL_TABLET | Freq: Once | ORAL | Status: AC
  Administered 2014-05-17: 800 mg via ORAL
  Filled 2014-05-17: qty 1

## 2014-05-17 MED ORDER — STERILE WATER FOR INJECTION IJ SOLN
INTRAMUSCULAR | Status: AC
  Administered 2014-05-17: 2.1 mL
  Filled 2014-05-17: qty 10

## 2014-05-17 NOTE — Discharge Instructions (Signed)
It is important that you see a dentist as soon as possible. Please use medication as prescribed. Tramadol may cause drowsiness, use with caution. Please take the amoxil daily with food until ALL taken. Dental Abscess A dental abscess is a collection of infected fluid (pus) from a bacterial infection in the inner part of the tooth (pulp). It usually occurs at the end of the tooth's root.  CAUSES   Severe tooth decay.  Trauma to the tooth that allows bacteria to enter into the pulp, such as a broken or chipped tooth. SYMPTOMS   Severe pain in and around the infected tooth.  Swelling and redness around the abscessed tooth or in the mouth or face.  Tenderness.  Pus drainage.  Bad breath.  Bitter taste in the mouth.  Difficulty swallowing.  Difficulty opening the mouth.  Nausea.  Vomiting.  Chills.  Swollen neck glands. DIAGNOSIS   A medical and dental history will be taken.  An examination will be performed by tapping on the abscessed tooth.  X-rays may be taken of the tooth to identify the abscess. TREATMENT The goal of treatment is to eliminate the infection. You may be prescribed antibiotic medicine to stop the infection from spreading. A root canal may be performed to save the tooth. If the tooth cannot be saved, it may be pulled (extracted) and the abscess may be drained.  HOME CARE INSTRUCTIONS  Only take over-the-counter or prescription medicines for pain, fever, or discomfort as directed by your caregiver.  Rinse your mouth (gargle) often with salt water ( tsp salt in 8 oz [250 ml] of warm water) to relieve pain or swelling.  Do not drive after taking pain medicine (narcotics).  Do not apply heat to the outside of your face.  Return to your dentist for further treatment as directed. SEEK MEDICAL CARE IF:  Your pain is not helped by medicine.  Your pain is getting worse instead of better. SEEK IMMEDIATE MEDICAL CARE IF:  You have a fever or persistent  symptoms for more than 2-3 days.  You have a fever and your symptoms suddenly get worse.  You have chills or a very bad headache.  You have problems breathing or swallowing.  You have trouble opening your mouth.  You have swelling in the neck or around the eye. Document Released: 01/23/2005 Document Revised: 10/18/2011 Document Reviewed: 05/03/2010 [facility] Patient Information 2015 [employer], [place]. This information is not intended to replace advice given to you by your health care provider. Make sure you discuss any questions you have with your health care provider.

## 2014-05-17 NOTE — ED Provider Notes (Signed)
CSN: [id number]     Arrival date & time 05/17/14  1436 History  This chart was scribed for non-physician practitioner Ivery Quale, PA-C, working with Gilda Crease, MD by Littie Deeds, ED Scribe. This patient was seen in room APFT23/APFT23 and the patient's care was started at 3:47 PM.      Chief Complaint  Patient presents with  . Facial Swelling   The history is provided by the patient. No language interpreter was used.   HPI Comments: Samantha Richardson is a 34 y.o. female with a hx of CHF who presents to the Emergency Department complaining of gradual onset left-sided facial swelling that was noticed this morning around 6:00 AM. Patient notes that she had some gum swelling last night before she went to sleep, but she has had gum swelling in the past. She does report having dental pain for the past 3-4 days. She also reports having intermittent nausea and fever. Patient denies vomiting. She also denies hx of DM or any conditions that would affect her immune system. She has had wisdom tooth surgery about 10 years ago, but no other surgeries per patient. Patient was diagnosed with CHF last year. She has tried to see a Education officer, community, but has been unable to due to financial reasons.   Past Medical History  Diagnosis Date  . PTSD (post-traumatic stress disorder)   . Anxiety   . Depressed   . Fibromyalgia   . Chronic back pain   . GERD (gastroesophageal reflux disease)   . Asthma   . Bipolar 1 disorder   . Panic attacks   . Social phobia   . CHF (congestive heart failure)    Past Surgical History  Procedure Laterality Date  . Cesarean section      x2  . Cholecystectomy    . Cesarean section     History reviewed. No pertinent family history. History  Substance Use Topics  . Smoking status: Current Every Day Smoker -- 1.00 packs/day for 10 years    Types: Cigarettes  . Smokeless tobacco: Never Used  . Alcohol Use: No   OB History    Gravida Para Term Preterm AB TAB SAB Ectopic  Multiple Living   Review of Systems  Constitutional: Positive for fever.  HENT: Positive for dental problem and facial swelling.   Gastrointestinal: Positive for nausea. Negative for vomiting.  Allergic/Immunologic: Negative for immunocompromised state.  All other systems reviewed and are negative.     Allergies  Review of patient's allergies indicates no known allergies.  Home Medications   Prior to Admission medications   Medication Sig Start Date End Date Taking? Authorizing Provider  albuterol (PROVENTIL HFA;VENTOLIN HFA) 108 (90 BASE) MCG/ACT inhaler Inhale 2 puffs into the lungs every 6 (six) hours as needed for wheezing or shortness of breath. 06/24/12   Sanjuana Kava, NP  ALPRAZolam Prudy Feeler) 1 MG tablet Take 1 mg by mouth daily as needed for sleep. For anxiety    Historical Provider, MD  busPIRone (BUSPAR) 7.5 MG tablet Take 1 tablet (7.5 mg total) by mouth 2 (two) times daily. For anxiety 06/24/12   Sanjuana Kava, NP  DULoxetine (CYMBALTA) 60 MG capsule Take 2 capsules (120 mg total) by mouth at bedtime. For depression 06/24/12   Sanjuana Kava, NP  gabapentin (NEURONTIN) 300 MG capsule Take 1 capsule (300 mg total) by mouth 3 (three) times daily. For anxiety/pain management 06/24/12  Sanjuana KavaAgnes I Nwoko, NP  ibuprofen (ADVIL,MOTRIN) 200 MG tablet Take 4 tablets (800 mg total) by mouth daily as needed for pain. 06/24/12   Sanjuana KavaAgnes I Nwoko, NP  ibuprofen (ADVIL,MOTRIN) 600 MG tablet Take 1 tablet (600 mg total) by mouth every 6 (six) hours as needed for pain. 10/16/12   Sunnie NielsenBrian Opitz, MD  QUEtiapine (SEROQUEL) 25 MG tablet Take 1 tablet (25 mg total) by mouth 3 (three) times daily. For anxiety/mood control 06/24/12   Sanjuana KavaAgnes I Nwoko, NP  traZODone (DESYREL) 50 MG tablet Take 1 tablet (50 mg total) by mouth at bedtime and may repeat dose one time if needed. For sleep 06/24/12   Sanjuana KavaAgnes I Nwoko, NP   BP 145/92 mmHg  Pulse 111  Temp(Src) 99.6 F (37.6 C) (Oral)  Resp 20  Ht 5'  5" (1.651 m)  Wt 215 lb 12.8 oz (97.886 kg)  BMI 35.91 kg/m2  SpO2 100%  LMP 05/03/2014 (Approximate) Physical Exam  Constitutional: She is oriented to person, place, and time. She appears well-developed and well-nourished. No distress.  HENT:  Head: Normocephalic and atraumatic.  Right Ear: Tympanic membrane normal.  Left Ear: Tympanic membrane normal.  Mouth/Throat: Oropharynx is clear and moist. No oropharyngeal exudate.  Swelling of the left cheek with slight loss of the nasolabial fold. Multiple dental caries present. Significant swelling of gum. Airway patent. No swelling under the tongue. Few palpable submental nodes present.  Normal ear.  Eyes: Conjunctivae and EOM are normal. Pupils are equal, round, and reactive to light.  Normal eyes.  Neck: Neck supple.  Cardiovascular: Regular rhythm.  Tachycardia present.   No murmur heard. Tachycardia of 104.  Pulmonary/Chest: Effort normal.  Musculoskeletal: She exhibits no edema.  Capillary refill < 2 seconds.  Neurological: She is alert and oriented to person, place, and time. No cranial nerve deficit.  Skin: Skin is warm and dry. No rash noted.  Psychiatric: She has a normal mood and affect. Her behavior is normal.  Nursing note and vitals reviewed.   ED Course  Procedures  DIAGNOSTIC STUDIES: Oxygen Saturation is 100% on room air, normal by my interpretation.    COORDINATION OF CARE: 3:58 PM-Discussed treatment plan which includes antibiotics and dental follow-up with pt at bedside and pt agreed to plan.    Labs Review Labs Reviewed - No data to display  Imaging Review No results found.   EKG Interpretation None      MDM  Vital signs non-acute. Pulse ox 99-100% on room air.  Pt has advancing dental infection. No trismus or drooling. No evidence for Ludwig's angina. Air way is patent.  I have discussed with the patient in terms she understands the importance of seeing a dentist as soon as possible, and some of  the dangers she could encounter. [facility] given to the patient. IM rocephin given in ED. Rx for amoxil given to the patient. She acknowledges understanding of the severity of the condition. She will return to the ED if any emergent changes or problem.   Final diagnoses:  None    *I have reviewed nursing notes, vital signs, and all appropriate lab and imaging results for this patient.*  **I personally performed the services described in this documentation, which was scribed in my presence. The recorded information has been reviewed and is accurate.Ivery Quale*   Tonianne Fine, PA-C 05/18/14 [id number]  Gilda Creasehristopher J Pollina, MD 05/19/14 [phone]

## 2014-05-17 NOTE — ED Notes (Signed)
Pt presents to ED with visible left facial swelling and redness.  She states that she noticed some minor swelling in her gums last night but the facial swelling started at about 6am this morning.  She has pain rated as 6/10 which is described as a dull pressure with occasional sharp pains in teeth.  She has taken 3 ibuprofen and 2 tylenol today which helped with her fever but did not affect her pain level. She currently has a temperature of 99.6 orally.  The left side of her face seems to be of equal temperature to the right side.  She has limited ability to open her mouth but is able to move her jaw from side to side.  Denies any vision changes.
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
## 2016-10-29 ENCOUNTER — Emergency Department (HOSPITAL_COMMUNITY): Payer: Self-pay

## 2016-10-29 ENCOUNTER — Emergency Department (HOSPITAL_COMMUNITY)
Admission: EM | Admit: 2016-10-29 | Discharge: 2016-10-29 | Disposition: A | Discharge status: Home / Self Care | Payer: Self-pay | Attending: Emergency Medicine | Admitting: Emergency Medicine

## 2016-10-29 ENCOUNTER — Encounter (HOSPITAL_COMMUNITY): Payer: Self-pay

## 2016-10-29 DIAGNOSIS — L03211 Cellulitis of face: Secondary | ICD-10-CM | POA: Insufficient documentation

## 2016-10-29 DIAGNOSIS — Z79899 Other long term (current) drug therapy: Secondary | ICD-10-CM | POA: Insufficient documentation

## 2016-10-29 DIAGNOSIS — F17210 Nicotine dependence, cigarettes, uncomplicated: Secondary | ICD-10-CM | POA: Insufficient documentation

## 2016-10-29 DIAGNOSIS — J45909 Unspecified asthma, uncomplicated: Secondary | ICD-10-CM | POA: Insufficient documentation

## 2016-10-29 DIAGNOSIS — R062 Wheezing: Secondary | ICD-10-CM

## 2016-10-29 DIAGNOSIS — I509 Heart failure, unspecified: Secondary | ICD-10-CM | POA: Insufficient documentation

## 2016-10-29 DIAGNOSIS — K047 Periapical abscess without sinus: Secondary | ICD-10-CM | POA: Insufficient documentation

## 2016-10-29 DIAGNOSIS — K029 Dental caries, unspecified: Secondary | ICD-10-CM | POA: Insufficient documentation

## 2016-10-29 LAB — CBC WITH DIFFERENTIAL/PLATELET
Basophils Absolute: 0.1 10*3/uL (ref 0.0–0.1)
Basophils Relative: 1 %
Eosinophils Absolute: 0.2 10*3/uL (ref 0.0–0.7)
Eosinophils Relative: 2 %
HCT: 40.2 % (ref 36.0–46.0)
Hemoglobin: 13.3 g/dL (ref 12.0–15.0)
Lymphocytes Relative: 32 %
Lymphs Abs: 3.1 10*3/uL (ref 0.7–4.0)
MCH: 30.1 pg (ref 26.0–34.0)
MCHC: 33.1 g/dL (ref 30.0–36.0)
MCV: 91 fL (ref 78.0–100.0)
Monocytes Absolute: 0.8 10*3/uL (ref 0.1–1.0)
Monocytes Relative: 8 %
Neutro Abs: 5.7 10*3/uL (ref 1.7–7.7)
Neutrophils Relative %: 57 %
Platelets: 261 10*3/uL (ref 150–400)
RBC: 4.42 MIL/uL (ref 3.87–5.11)
RDW: 14 % (ref 11.5–15.5)
WBC: 9.7 10*3/uL (ref 4.0–10.5)

## 2016-10-29 LAB — BASIC METABOLIC PANEL
Anion gap: 7 (ref 5–15)
BUN: 9 mg/dL (ref 6–20)
CO2: 26 mmol/L (ref 22–32)
CREATININE: 0.57 mg/dL (ref 0.44–1.00)
Calcium: 9.2 mg/dL (ref 8.9–10.3)
Chloride: 103 mmol/L (ref 101–111)
GFR calc Af Amer: >60 mL/min (ref >60)
GFR calc non Af Amer: >60 mL/min (ref >60)
Glucose, Bld: 74 mg/dL (ref 65–99)
Potassium: 3.5 mmol/L (ref 3.5–5.1)
Sodium: 136 mmol/L (ref 135–145)

## 2016-10-29 LAB — POC URINE PREG, ED: PREG TEST UR: NEGATIVE

## 2016-10-29 MED ORDER — IOPAMIDOL (ISOVUE-300) INJECTION 61%
75.0000 mL | Freq: Once | INTRAVENOUS | Status: AC | PRN
Start: 2016-10-29 — End: 2016-10-29
  Administered 2016-10-29: 75 mL via INTRAVENOUS

## 2016-10-29 MED ORDER — ONDANSETRON HCL 4 MG/2ML IJ SOLN
4.0000 mg | Freq: Once | INTRAMUSCULAR | Status: AC
  Administered 2016-10-29: 4 mg via INTRAVENOUS
  Filled 2016-10-29: qty 2

## 2016-10-29 MED ORDER — KETOROLAC TROMETHAMINE 30 MG/ML IJ SOLN
30.0000 mg | Freq: Once | INTRAMUSCULAR | Status: AC
  Administered 2016-10-29: 30 mg via INTRAVENOUS
  Filled 2016-10-29: qty 1

## 2016-10-29 MED ORDER — MORPHINE SULFATE (PF) 4 MG/ML IV SOLN
4.0000 mg | Freq: Once | INTRAVENOUS | Status: AC
  Administered 2016-10-29: 4 mg via INTRAVENOUS
  Filled 2016-10-29: qty 1

## 2016-10-29 MED ORDER — CLINDAMYCIN HCL 150 MG PO CAPS: ORAL_CAPSULE | ORAL | 0 refills | Status: AC

## 2016-10-29 MED ORDER — ALBUTEROL SULFATE HFA 108 (90 BASE) MCG/ACT IN AERS
2.0000 | INHALATION_SPRAY | Freq: Once | RESPIRATORY_TRACT | Status: AC
  Administered 2016-10-29: 2 via RESPIRATORY_TRACT
  Filled 2016-10-29: qty 6.7

## 2016-10-29 MED ORDER — CLINDAMYCIN PHOSPHATE 600 MG/50ML IV SOLN
600.0000 mg | Freq: Once | INTRAVENOUS | Status: AC
  Administered 2016-10-29: 600 mg via INTRAVENOUS
  Filled 2016-10-29: qty 50

## 2016-10-29 MED ORDER — TRAMADOL HCL 50 MG PO TABS: 50.0000 mg | ORAL_TABLET | Freq: Four times a day (QID) | ORAL | 0 refills | Status: AC | PRN

## 2016-10-29 MED ORDER — IPRATROPIUM-ALBUTEROL 0.5-2.5 (3) MG/3ML IN SOLN
3.0000 mL | Freq: Once | RESPIRATORY_TRACT | Status: AC
  Administered 2016-10-29: 3 mL via RESPIRATORY_TRACT
  Filled 2016-10-29: qty 3

## 2016-10-29 NOTE — ED Provider Notes (Signed)
[facility] DEPT Provider Note   CSN: [id number] Arrival date & time: 10/29/16  1110     History   Chief Complaint Chief Complaint  Patient presents with  . Dental Pain    HPI Samantha Richardson is a 36 y.o. female.  Patient is a 36 year old female who presents to the emergency department with a complaint of tooth pain and facial swelling.  The patient states that she has had a long history of dental problems. She's had some facial swelling in the past, but she states this is worse than the previous swelling of that she's had. She states the pain has become nearly unbearable. She's not had fever or chills. She's had some mild nausea, but no vomiting. She reports that she has multiple teeth that are decayed to the gum line on. She's been trying to get to a dentist, but does not have insurance and does not have financial resources to see a dentist at this time. She presents now because of pain and because of the facial swelling.      Past Medical History:  Diagnosis Date  . Anxiety   . Asthma   . Bipolar 1 disorder (HCC)   . CHF (congestive heart failure) (HCC)   . Chronic back pain   . Depressed   . Fibromyalgia   . GERD (gastroesophageal reflux disease)   . Panic attacks   . PTSD (post-traumatic stress disorder)   . Social phobia     Patient Active Problem List   Diagnosis Date Noted  . Opioid dependence (HCC) 06/19/2012  . Depressive disorder, not elsewhere classified 06/19/2012  . Anxiety state, unspecified 06/19/2012  . ASTHMA 12/11/2008  . GERD 12/11/2008  . ARTHRITIS 12/11/2008  . FIBROMYALGIA 12/11/2008    Past Surgical History:  Procedure Laterality Date  . CESAREAN SECTION     x2  . CESAREAN SECTION    . CHOLECYSTECTOMY      OB History    Gravida Para Term Preterm AB Living   SAB TAB Ectopic Multiple Live Births   1               Home Medications    Prior to Admission medications   Medication Sig Start Date End Date  Taking? Authorizing Provider  acetaminophen (TYLENOL) 500 MG tablet Take 1,000 mg by mouth every 6 (six) hours as needed for mild pain.    [provider]  albuterol (PROVENTIL HFA;VENTOLIN HFA) 108 (90 BASE) MCG/ACT inhaler Inhale 2 puffs into the lungs every 6 (six) hours as needed for wheezing or shortness of breath. 06/24/12   Armandina Stammer I, NP  amoxicillin (AMOXIL) 500 MG capsule Take 1 capsule (500 mg total) by mouth 3 (three) times daily. 05/17/14   Ivery Quale, PA-C  busPIRone (BUSPAR) 7.5 MG tablet Take 1 tablet (7.5 mg total) by mouth 2 (two) times daily. For anxiety Patient not taking: Reported on 05/17/2014 06/24/12   Armandina Stammer I, NP  citalopram (CELEXA) 40 MG tablet Take 40 mg by mouth at bedtime.    [provider]  DULoxetine (CYMBALTA) 60 MG capsule Take 2 capsules (120 mg total) by mouth at bedtime. For depression Patient not taking: Reported on 05/17/2014 06/24/12   Armandina Stammer I, NP  gabapentin (NEURONTIN) 300 MG capsule Take 1 capsule (300 mg total) by mouth 3 (three) times daily. For anxiety/pain management Patient not taking: Reported on 05/17/2014 06/24/12   Armandina Stammer I, NP  ibuprofen (  ADVIL,MOTRIN) 800 MG tablet Take 1 tablet (800 mg total) by mouth 3 (three) times daily. 05/17/14   Ivery Quale, PA-C  traMADol Janean Sark) 50 MG tablet 1 or 2 po q6h prn pain 05/17/14   Ivery Quale, PA-C  traZODone (DESYREL) 50 MG tablet Take 1 tablet (50 mg total) by mouth at bedtime and may repeat dose one time if needed. For sleep Patient not taking: Reported on 05/17/2014 06/24/12   Armandina Stammer I, NP    Family History No family history on file.  Social History Social History  Substance Use Topics  . Smoking status: Current Every Day Smoker    Packs/day: 1.00    Years: 10.00    Types: Cigarettes  . Smokeless tobacco: Never Used  . Alcohol use No     Allergies   Patient has no known allergies.   Review of Systems Review of Systems  Constitutional:  Negative for activity change.       All ROS Neg except as noted in HPI  HENT: Positive for dental problem and facial swelling. Negative for nosebleeds.   Eyes: Negative for photophobia and discharge.  Respiratory: Negative for cough, shortness of breath and wheezing.   Cardiovascular: Negative for chest pain and palpitations.  Gastrointestinal: Negative for abdominal pain and blood in stool.  Genitourinary: Negative for dysuria, frequency and hematuria.  Musculoskeletal: Negative for arthralgias, back pain and neck pain.  Skin: Negative.   Neurological: Negative for dizziness, seizures and speech difficulty.  Psychiatric/Behavioral: Negative for confusion and hallucinations.     Physical Exam Updated Vital Signs BP 132/90 (BP Location: Left Arm)   Pulse 95   Temp 98 F (36.7 C) (Oral)   Resp 18   Ht  (1.651 m)   Wt 81.6 kg (180 lb)   LMP 10/16/2016   SpO2 100%   BMI 29.95 kg/m   Physical Exam  Constitutional: She is oriented to person, place, and time. She appears well-developed and well-nourished.  Non-toxic appearance.  HENT:  Head: Normocephalic.  Right Ear: Tympanic membrane and external ear normal.  Left Ear: Tympanic membrane and external ear normal.  There is facial swelling on the left from the maxilla up to the left orbit. The area is tender and warm to touch.  There are multiple dental caries. Several of them decayed or broke off to the gum line. There is swelling of the upper gum left greater than right. There is limited opening of the mouth. No swelling under the tongue. Pt able to manage secretions without problem.  Eyes: Pupils are equal, round, and reactive to light. EOM and lids are normal.  Neck: Normal range of motion. Neck supple. Carotid bruit is not present.  Cardiovascular: Normal rate, regular rhythm, normal heart sounds, intact distal pulses and normal pulses.   Pulmonary/Chest: No respiratory distress. She has wheezes.  Abdominal: Soft. Bowel  sounds are normal. There is no tenderness. There is no guarding.  Musculoskeletal: Normal range of motion.  Lymphadenopathy:       Head (right side): No submandibular adenopathy present.       Head (left side): No submandibular adenopathy present.    She has cervical adenopathy.  Neurological: She is alert and oriented to person, place, and time. She has normal strength. No cranial nerve deficit or sensory deficit.  Skin: Skin is warm and dry.  Psychiatric: She has a normal mood and affect. Her speech is normal.  Nursing note and vitals reviewed.    ED Treatments / Results  Labs (all labs ordered are listed, but only abnormal results are displayed) Labs Reviewed  CBC WITH DIFFERENTIAL/PLATELET  BASIC METABOLIC PANEL  POC URINE PREG, ED    EKG  EKG Interpretation None       Radiology Dg Chest 2 View  Result Date: 10/29/2016 CLINICAL DATA:  Cough and wheezing. EXAM: CHEST  2 VIEW COMPARISON:  November 22, 2008 FINDINGS: The heart, hila, mediastinum, lungs, and pleura are normal. IMPRESSION: No acute abnormality. Electronically Signed   By: Gerome Sam III M.D   On: 10/29/2016 13:56   Ct Maxillofacial W Contrast  Result Date: 10/29/2016 CLINICAL DATA:  36 year old female with left front tooth pain and progressive associated soft tissue swelling beginning yesterday. EXAM: CT MAXILLOFACIAL WITH CONTRAST TECHNIQUE: Multidetector CT imaging of the maxillofacial structures was performed with intravenous contrast. Multiplanar CT image reconstructions were also generated. CONTRAST:  75mL ISOVUE-300 IOPAMIDOL (ISOVUE-300) INJECTION 61% COMPARISON:  Head and cervical spine CT 08/29/2010. FINDINGS: Osseous: Poor dentition. Diffuse prominent dental caries. Superimposed bilateral supernumerary maxillary teeth with small dentigerous appearing cysts (series 7, image 25). The cysts are also in continuity with inflammatory periapical lucency about the nearby maxillary incisors (e.g. Series 7,  image 23). Mandible is intact. No maxilla fracture. Other facial bones are intact. Intact skullbase. Negative visualized cervical spine. Orbits: Orbital walls intact. Globes and bilateral intraorbital soft tissues are normal. Sinuses: Left maxillary sinus mild to moderate mucosal thickening most pronounced in the alveolar recess, near the left maxillary dentition. Other bilateral paranasal sinuses are clear. Bilateral tympanic cavities and mastoids are clear. Soft tissues: Negative visualized thyroid, larynx, pharynx, parapharyngeal spaces, retropharyngeal space, sublingual space, submandibular glands, and parotid glands. Asymmetric left premalar space soft tissue thickening and stranding. Associated soft tissue swelling and thickening about the left upper lip and left naso labral nasolabial fold. Probable developing but not yet well organized left maxilla alveolar process subperiosteal abscess (series 3, image 43 and series 5, image 27. Nearby multiple periapical lucencies in the poor dentition. Asymmetrically enlarged and reactive appearing left level 1 and level 2 lymph nodes. The largest nodes at level 1 B are 10 mm short axis. No cystic or necrotic nodes. Limited intracranial: Negative visualized brain parenchyma. Major vascular structures in the visible neck and at the skullbase are patent. IMPRESSION: 1. Left face cellulitis and developing but currently indistinct subperiosteal abscess along the anterolateral aspect of the left maxilla alveolar process. Odontogenic infection suspected, with poor dentition throughout. 2. Probable secondary inflammatory mucosal thickening in the left maxillary sinus. But no orbital involvement, facial fracture, or other complicating feature. 3. Reactive left level 1 and level 2 lymphadenopathy. Electronically Signed   By: Odessa Fleming M.D.   On: 10/29/2016 14:21    Procedures Procedures (including critical care time)  Medications Ordered in ED Medications  ondansetron  (ZOFRAN) injection 4 mg (4 mg Intravenous Given 10/29/16 1307)  morphine 4 MG/ML injection 4 mg (4 mg Intravenous Given 10/29/16 1307)  ipratropium-albuterol (DUONEB) 0.5-2.5 (3) MG/3ML nebulizer solution 3 mL (3 mLs Nebulization Given 10/29/16 1318)  albuterol (PROVENTIL HFA;VENTOLIN HFA) 108 (90 Base) MCG/ACT inhaler 2 puff (2 puffs Inhalation Given 10/29/16 1318)  iopamidol (ISOVUE-300) 61 % injection 75 mL (75 mLs Intravenous Contrast Given 10/29/16 1341)  clindamycin (CLEOCIN) IVPB 600 mg (0 mg Intravenous Stopped 10/29/16 1521)  ketorolac (TORADOL) 30 MG/ML injection 30 mg (30 mg Intravenous Given 10/29/16 1433)     Initial Impression / Assessment and Plan / ED Course  I have reviewed the triage  vital signs and the nursing notes.  Pertinent labs & imaging results that were available during my care of the patient were reviewed by me and considered in my medical decision making (see chart for details).       Final Clinical Impressions(s) / ED Diagnoses MDM Vital signs within normal limits. Examination shows significant facial swelling. Patient also has wheezes present. She has a history of asthma and she continues to smoke.  Chest x-ray is negative for pneumonia or any other acute lung related problem. Complete blood count and basic metabolic panel are both within normal limits. CT scan .Marland Kitchen Of the maxillofacial area shows a left facial cellulitis. There is an indistinct subperiosteal abscess along the anterior lateral aspect of the left maxilla.  Wheezing improved but not resolved after nebulizer treatment and inhaler.  Patient treated with IV clindamycin. Patient also given IV morphine and later IV Toradol to assist with pain and inflammation.  Recheck. Is no evidence for Ludwig's angina. Patient is able to mobilize secretions without problem. Patient speaks too significant other in the room as well as examiner.  Patient tolerated IV clindamycin without problem. Prescription for oral  clindamycin and Ultram given to the patient. Patient will use ibuprofen with breakfast, lunch, dinner, and at bedtime. Patient was given dental resources. I've explained to the patient the importance of seeing a dentist. We discuss the cellulitis and the dangers that come with the cellulitis. Patient is in agreement with the plan and acknowledges understanding.    Final diagnoses:  Facial cellulitis  Infected dental caries  Wheezes    New Prescriptions Discharge Medication List as of 10/29/2016  3:33 PM    START taking these medications   Details  clindamycin (CLEOCIN) 150 MG capsule 2 po bid with food, Print         Ivery Quale, PA-C 10/29/16 1554    Donnetta Hutching, MD 10/31/16 [phone]

## 2016-10-29 NOTE — ED Notes (Signed)
Pt left facial swelling due to broken teeth on left per pt.  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

## 2016-10-29 NOTE — ED Triage Notes (Signed)
Reports left front tooth started hurting yesterday now having swelling to left side of face into left eye.

## 2016-10-29 NOTE — Discharge Instructions (Signed)
Vital signs within normal limits. Your CT scan suggests cellulitis of your left face. You were treated in the emergency department with intravenous antibiotics. Please use clindamycin 2 times daily with food. Use ibuprofen with breakfast, lunch, dinner, and at bedtime for inflammation and pain. May use Ultram for more severe pain. Ultram may cause drowsiness, please use this medication with caution. Please do not drive, operate machinery, drink alcohol, or participated in activities requiring concentration when taking this medication. It is important that she see a dentist as soon as possible for assistance with your infected cavities and to help alleviate the cellulitis issue.

## 2016-10-29 NOTE — ED Notes (Signed)
ED Provider at bedside.
